# Patient Record
Sex: Female | Born: 1940 | Race: White | Hispanic: No | Smoking: Never smoker
Health system: Southern US, Community
[De-identification: ages and names within clinical notes are randomized; demographics above are authoritative.]

## PROBLEM LIST (undated history)

## (undated) DIAGNOSIS — S42401A Unspecified fracture of lower end of right humerus, initial encounter for closed fracture: Secondary | ICD-10-CM

## (undated) DIAGNOSIS — I10 Essential (primary) hypertension: Secondary | ICD-10-CM

## (undated) DIAGNOSIS — L309 Dermatitis, unspecified: Secondary | ICD-10-CM

## (undated) DIAGNOSIS — M722 Plantar fascial fibromatosis: Secondary | ICD-10-CM

## (undated) DIAGNOSIS — E785 Hyperlipidemia, unspecified: Secondary | ICD-10-CM

## (undated) DIAGNOSIS — C4490 Unspecified malignant neoplasm of skin, unspecified: Secondary | ICD-10-CM

## (undated) DIAGNOSIS — N189 Chronic kidney disease, unspecified: Secondary | ICD-10-CM

## (undated) DIAGNOSIS — K760 Fatty (change of) liver, not elsewhere classified: Secondary | ICD-10-CM

## (undated) DIAGNOSIS — S46019A Strain of muscle(s) and tendon(s) of the rotator cuff of unspecified shoulder, initial encounter: Secondary | ICD-10-CM

## (undated) DIAGNOSIS — M7062 Trochanteric bursitis, left hip: Secondary | ICD-10-CM

## (undated) DIAGNOSIS — C4492 Squamous cell carcinoma of skin, unspecified: Secondary | ICD-10-CM

## (undated) DIAGNOSIS — M5412 Radiculopathy, cervical region: Secondary | ICD-10-CM

## (undated) DIAGNOSIS — J45909 Unspecified asthma, uncomplicated: Secondary | ICD-10-CM

## (undated) HISTORY — DX: Unspecified asthma, uncomplicated: J45.909

## (undated) HISTORY — DX: Hyperlipidemia, unspecified: E78.5

## (undated) HISTORY — DX: Plantar fascial fibromatosis: M72.2

## (undated) HISTORY — DX: Chronic kidney disease, unspecified: N18.9

## (undated) HISTORY — DX: Fatty (change of) liver, not elsewhere classified: K76.0

## (undated) HISTORY — DX: Essential (primary) hypertension: I10

## (undated) HISTORY — DX: Squamous cell carcinoma of skin, unspecified: C44.92

## (undated) HISTORY — DX: Strain of muscle(s) and tendon(s) of the rotator cuff of unspecified shoulder, initial encounter: S46.019A

## (undated) HISTORY — DX: Unspecified fracture of lower end of right humerus, initial encounter for closed fracture: S42.401A

## (undated) HISTORY — DX: Radiculopathy, cervical region: M54.12

## (undated) HISTORY — PX: BREAST CYST ASPIRATION: SHX578

## (undated) HISTORY — DX: Dermatitis, unspecified: L30.9

## (undated) HISTORY — DX: Trochanteric bursitis, left hip: M70.62

---

## 1997-09-17 ENCOUNTER — Other Ambulatory Visit: Admission: RE | Admit: 1997-09-17 | Discharge: 1997-09-17 | Payer: Self-pay | Admitting: *Deleted

## 2000-03-28 ENCOUNTER — Encounter: Payer: Self-pay | Admitting: Internal Medicine

## 2000-03-28 ENCOUNTER — Encounter: Admission: RE | Admit: 2000-03-28 | Discharge: 2000-03-28 | Payer: Self-pay | Admitting: Internal Medicine

## 2001-08-27 ENCOUNTER — Encounter: Payer: Self-pay | Admitting: Internal Medicine

## 2001-08-27 ENCOUNTER — Encounter: Admission: RE | Admit: 2001-08-27 | Discharge: 2001-08-27 | Payer: Self-pay | Admitting: Internal Medicine

## 2003-01-15 ENCOUNTER — Encounter: Admission: RE | Admit: 2003-01-15 | Discharge: 2003-01-15 | Payer: Self-pay | Admitting: Internal Medicine

## 2003-01-15 ENCOUNTER — Encounter: Payer: Self-pay | Admitting: Internal Medicine

## 2004-11-26 ENCOUNTER — Encounter: Admission: RE | Admit: 2004-11-26 | Discharge: 2004-11-26 | Payer: Self-pay | Admitting: Internal Medicine

## 2006-01-12 ENCOUNTER — Encounter: Admission: RE | Admit: 2006-01-12 | Discharge: 2006-01-12 | Payer: Self-pay | Admitting: Internal Medicine

## 2007-11-06 ENCOUNTER — Ambulatory Visit (HOSPITAL_BASED_OUTPATIENT_CLINIC_OR_DEPARTMENT_OTHER): Admission: RE | Admit: 2007-11-06 | Discharge: 2007-11-06 | Payer: Self-pay | Admitting: Orthopedic Surgery

## 2009-05-30 ENCOUNTER — Emergency Department (HOSPITAL_BASED_OUTPATIENT_CLINIC_OR_DEPARTMENT_OTHER): Admission: EM | Admit: 2009-05-30 | Discharge: 2009-05-30 | Payer: Self-pay | Admitting: Emergency Medicine

## 2009-05-30 ENCOUNTER — Ambulatory Visit: Payer: Self-pay | Admitting: Diagnostic Radiology

## 2009-08-13 ENCOUNTER — Encounter: Admission: RE | Admit: 2009-08-13 | Discharge: 2009-08-13 | Payer: Self-pay | Admitting: Internal Medicine

## 2010-10-26 NOTE — Op Note (Signed)
NAMEBEREA, NOGUERAS                 ACCOUNT NO.:  [id number]   MEDICAL RECORD NO.:  [id number]          PATIENT TYPE:  AMB   LOCATION:  [place]                          FACILITY:  [facility]   PHYSICIAN:  Youlanda Mighty. Sypher, M.D. 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   POSTOPERATIVE DIAGNOSIS:  Chronic stenosing tenosynovitis, right thumb  at A1 pulley.   OPERATION:  Release of right thumb A1 pulley.   OPERATING SURGEON:  Theodis Sato, MD   ASSISTANT:  Leverne Humbles, PA-C   ANESTHESIA:  2% lidocaine, metacarpal head level block, and flexor  sheath block right thumb without supplemental intravenous sedation.   This was a minor operating room case.   INDICATIONS:  Chelita Haver is a 70 year old woman, referred to the  courtesy of Dr. Nehemiah Settle.   She has had a history of chronic stenosing tenosynovitis of her right  thumb at the A1 pulley.  Due to a failed respond to nonoperative  measures, she is brought to the operating at this time for release of  her right thumb A1 pulley.   She was offered an opportunity to have surgery with local anesthesia  versus local and sedation.  She chose straight local anesthesia.  She  was brought to room 6, placed in supine position on the operating table,  and after informed consent and Betadine prep, 2% lidocaine was  infiltrated to the path of the intended incision and flexor sheath for  the right thumb.   After a few moments, she had excellent anesthesia of her right thumb.  The arm was then prepped with Betadine soap solution, sterilely draped.  A pneumatic tourniquet was brought at the right brachium.   Following exsanguination of the right arm with Esmarch bandage an  arterial tourniquet of proximal brachium was inflated to 220 mmHg.  The  procedure commenced with  short transverse incision directly over the  palpably thickened A1 pulley.  The subcutaneous tissue was carefully  divided from the radial proper digital nerve.  This was gently retracted  with a Ragnell retractor.  The pulley was isolated with a Soil scientist  followed by splitting of the pulley with scalpel and scissors.  The  tendon was delivered and found to be moderately swollen distal to the  pulley.   The wound was then repaired with mattress sutures of 5-0 nylon.   A compressor dressing was applied with Xeroflo sterile gauze and Coban.   There are no apparent complications.   Ms. Lisa tolerated surgery and anesthesia well.  She was transferred to  recovery room in stable signs.      Youlanda Mighty Sypher, M.D.  Electronically Signed     RVS/MEDQ  D:  11/06/2007  T:  11/07/2007  Job:  [id number]   cc:   Nehemiah Settle, M.D.

## 2014-12-11 ENCOUNTER — Ambulatory Visit (INDEPENDENT_AMBULATORY_CARE_PROVIDER_SITE_OTHER): Payer: PPO | Admitting: Cardiology

## 2014-12-11 ENCOUNTER — Encounter: Payer: Self-pay | Admitting: Cardiology

## 2014-12-11 VITALS — BP 124/74 | HR 64 | Ht 64.0 in | Wt 204.0 lb

## 2014-12-11 DIAGNOSIS — R079 Chest pain, unspecified: Secondary | ICD-10-CM | POA: Insufficient documentation

## 2014-12-11 DIAGNOSIS — I10 Essential (primary) hypertension: Secondary | ICD-10-CM

## 2014-12-11 DIAGNOSIS — R072 Precordial pain: Secondary | ICD-10-CM | POA: Diagnosis not present

## 2014-12-11 DIAGNOSIS — R600 Localized edema: Secondary | ICD-10-CM

## 2014-12-11 DIAGNOSIS — R0609 Other forms of dyspnea: Secondary | ICD-10-CM

## 2014-12-11 DIAGNOSIS — I739 Peripheral vascular disease, unspecified: Secondary | ICD-10-CM | POA: Diagnosis not present

## 2014-12-11 DIAGNOSIS — E785 Hyperlipidemia, unspecified: Secondary | ICD-10-CM

## 2014-12-11 MED ORDER — FUROSEMIDE 20 MG PO TABS: 20.0000 mg | ORAL_TABLET | Freq: Every day | ORAL | Status: DC

## 2014-12-11 NOTE — Progress Notes (Signed)
Patient ID: Caitlin Potts, female   DOB: 17-Apr-1941, 74 y.o.   MRN: [id number]      Cardiology Office Note  Date:  12/11/2014   ID:  [name], [name] 1940/10/30, MRN [id number]  PCP:  No primary care provider on file.  Cardiologist:  Dorothy Spark, MD    Chief complain: DOE  History of Present Illness: Caitlin Potts is a 74 y.o. female who presents for evaluation of DOE. The patient has h/o DM, HTN, HLP, CKD 3, obesity, FH of CAD (sister had MI at age 69) who went to the ER for an exertional retrosternal chest pain. MI was ruled out and she was referred to [place] for further work up. She states that she has been experencing progressively worsening DOE and fatigue with mild exertion like cleaning, doing laundry. She has to take several breaks to finish the task. No palpitations or syncope.  She has been also experiencing LE claudications after walking short distances, mild LE edema, on and off PND.  Past Medical History  Diagnosis Date  . Asthma   . Hypertension   . Cervical radiculopathy   . Rotator cuff strain   . Hyperlipidemia   . Eczema   . Elbow fracture, right   . Plantar fasciitis   . Fatty infiltration of liver   . Chronic kidney disease     stage III  . Greater trochanteric bursitis of left hip   . SCCA (squamous cell carcinoma) of skin     No past surgical history on file.   Current Outpatient Prescriptions  Medication Sig Dispense Refill  . albuterol (PROVENTIL HFA;VENTOLIN HFA) 108 (90 BASE) MCG/ACT inhaler Inhale 2 puffs into the lungs every 4 (four) hours as needed for wheezing or shortness of breath.    Marland Kitchen atorvastatin (LIPITOR) 10 MG tablet Take 10 mg by mouth daily.    . Calcium 600-200 MG-UNIT per tablet Take 1 tablet by mouth 2 (two) times daily.    Marland Kitchen diltiazem (DILACOR XR) 240 MG 24 hr capsule Take 240 mg by mouth daily.    . fluticasone (FLOVENT HFA) 220 MCG/ACT inhaler Inhale 2 puffs into the lungs 2 (two) times daily.    . furosemide (LASIX) 20 MG  tablet Take 1 tablet (20 mg total) by mouth daily. 90 tablet 3  . lisinopril-hydrochlorothiazide (PRINZIDE,ZESTORETIC) 20-25 MG per tablet Take 1 tablet by mouth daily.    . metoprolol succinate (TOPROL-XL) 50 MG 24 hr tablet Take 50 mg by mouth daily. Take with or immediately following a meal.    . Multiple Vitamins-Minerals (MULTIVITAMIN WITH MINERALS) tablet Take 1 tablet by mouth daily.    Marland Kitchen omeprazole (PRILOSEC) 20 MG capsule Take 20 mg by mouth daily.     No current facility-administered medications for this visit.    Allergies:   Penicillins and Sulfa antibiotics    Social History:  The patient  reports that she has never smoked. She does not have any smokeless tobacco history on file.   Family History:  The patient's family history includes CVA in her father; Heart disease in her sister; Lung cancer in her brother; Parkinsonism in her brother and mother.   ROS:  Please see the history of present illness.   Otherwise, review of systems are positive for none.   All other systems are reviewed and negative.   PHYSICAL EXAM: VS:  BP 124/74 mmHg  Pulse 64  Ht 5\' 4"  (1.626 m)  Wt 204 lb (92.534 kg)  BMI  35.00 kg/m2 , BMI Body mass index is 35 kg/(m^2). GEN: Well nourished, well developed, in no acute distress HEENT: normal Neck: no JVD, carotid bruits, or masses Cardiac: RRR; no murmurs, rubs, or gallops,no edema  Respiratory:  clear to auscultation bilaterally, normal work of breathing GI: soft, nontender, nondistended, + BS MS: no deformity or atrophy Skin: warm and dry, no rash Neuro:  Strength and sensation are intact Psych: euthymic mood, full affect  EKG:  EKG is not ordered today. The ekg ordered in ER demonstrates: SB, non-specific ST-T wave abnormalities  Recent Labs: No results found for requested labs within last 365 days.   Lipid Panel No results found for: CHOL, TRIG, HDL, CHOLHDL, VLDL, LDLCALC, LDLDIRECT   HDL 56 LDL 92 TRIG 224 Crea 1.59, GFR 32 HbA1c  6.7%   Wt Readings from Last 3 Encounters:  12/11/14 204 lb (92.534 kg)    Other studies Reviewed: Additional studies/ records that were reviewed today include: ER notes. PCP notes. Review of the above records demonstrates: as per HPI    ASSESSMENT AND PLAN:  1.  Exertional dyspnea - typical suspicious of CAD, we will schedule a Lexiscan nuclear stress test.  2. LE edema, PND - order echocardiogram, add lasix 20 mg po daily  3. Claudications - order B/L LE Duplex  4. HLP - LDL goal < 70, I will increase atorvastatin based on stress test results.   5. HTN - controlled on current regimen  Orders Placed This Encounter  Procedures  . Myocardial Perfusion Imaging  . Echocardiogram   Follow up in 3 month.  Signed, Dorothy Spark, MD  12/11/2014 5:19 PM    [facility] [place], [place], [place]  [address] Phone: [phone]; Fax: [phone]

## 2014-12-11 NOTE — Patient Instructions (Signed)
Medication Instructions:   START LASIX 20 MG ONCE DAILY      Testing/Procedures:  Your physician has requested that you have an echocardiogram. Echocardiography is a painless test that uses sound waves to create images of your heart. It provides your doctor with information about the size and shape of your heart and how well your heart's chambers and valves are working. This procedure takes approximately one hour. There are no restrictions for this procedure.   Your physician has requested that you have a lower  extremity arterial duplex. This test is an ultrasound of the arteries in the legs or arms. It looks at arterial blood flow in the legs and arms. Allow one hour for Lower and Upper Arterial scans. There are no restrictions or special instructions   Your physician has requested that you have a lexiscan myoview. For further information please visit [email or web address]. Please follow instruction sheet, as given.     Follow-Up:  3 MONTHS WITH DR Meda Coffee

## 2014-12-19 ENCOUNTER — Other Ambulatory Visit: Payer: Self-pay

## 2014-12-19 ENCOUNTER — Ambulatory Visit (HOSPITAL_COMMUNITY): Payer: PPO | Attending: Cardiology

## 2014-12-19 ENCOUNTER — Ambulatory Visit (HOSPITAL_BASED_OUTPATIENT_CLINIC_OR_DEPARTMENT_OTHER): Payer: PPO

## 2014-12-19 DIAGNOSIS — I081 Rheumatic disorders of both mitral and tricuspid valves: Secondary | ICD-10-CM | POA: Insufficient documentation

## 2014-12-19 DIAGNOSIS — I739 Peripheral vascular disease, unspecified: Secondary | ICD-10-CM | POA: Diagnosis not present

## 2014-12-19 DIAGNOSIS — R0609 Other forms of dyspnea: Secondary | ICD-10-CM

## 2014-12-19 DIAGNOSIS — R072 Precordial pain: Secondary | ICD-10-CM | POA: Diagnosis not present

## 2014-12-19 DIAGNOSIS — E119 Type 2 diabetes mellitus without complications: Secondary | ICD-10-CM | POA: Insufficient documentation

## 2014-12-19 DIAGNOSIS — E785 Hyperlipidemia, unspecified: Secondary | ICD-10-CM | POA: Diagnosis not present

## 2014-12-19 DIAGNOSIS — I10 Essential (primary) hypertension: Secondary | ICD-10-CM | POA: Insufficient documentation

## 2014-12-23 ENCOUNTER — Telehealth: Payer: Self-pay | Admitting: *Deleted

## 2014-12-23 ENCOUNTER — Telehealth (HOSPITAL_COMMUNITY): Payer: Self-pay

## 2014-12-23 MED ORDER — AMLODIPINE BESYLATE 5 MG PO TABS: 5.0000 mg | ORAL_TABLET | Freq: Every day | ORAL | Status: DC

## 2014-12-23 NOTE — Telephone Encounter (Signed)
Contacted the pt to inform her that per Dr Meda Coffee her echo showed normal LVEF but mild pulmonary HTN, and she recommends switching the pts cardizem to amlodipine 5 mg po daily, and follow-up with Dr Meda Coffee in 2 months.  Confirmed the pharmacy of choice with the pt.  Pt already has a scheduled 2 month follow-up appt with Dr Meda Coffee set for 03/11/18 at 0945.  Pt states she only wants a months supply sent to her pharmacy for right now, to make sure she tolerates the amlodipine well. Sent in a months supply as requested. Pt verbalized understanding and agrees with this plan.

## 2014-12-23 NOTE — Telephone Encounter (Signed)
Patient given detailed instructions per Myocardial Perfusion Study Information Sheet for test on 12-25-2014 at 0900. Patient Notified to arrive 15 minutes early, and that it is imperative to arrive on time for appointment to keep from having the test rescheduled. Patient verbalized understanding. Caitlin Potts, [name] A

## 2014-12-23 NOTE — Telephone Encounter (Signed)
-----   Message from Dorothy Spark, MD sent at 12/21/2014  6:53 AM EDT ----- She has normal LVEF but mild pulmonary hypertension. I would switch cardizem to amlodipine 5 mg po daily and follow up with me in 2 months.

## 2014-12-25 ENCOUNTER — Ambulatory Visit (HOSPITAL_COMMUNITY): Payer: PPO | Attending: Internal Medicine

## 2014-12-25 DIAGNOSIS — I739 Peripheral vascular disease, unspecified: Secondary | ICD-10-CM | POA: Diagnosis not present

## 2014-12-25 DIAGNOSIS — R9439 Abnormal result of other cardiovascular function study: Secondary | ICD-10-CM | POA: Insufficient documentation

## 2014-12-25 DIAGNOSIS — R072 Precordial pain: Secondary | ICD-10-CM

## 2014-12-25 DIAGNOSIS — R0609 Other forms of dyspnea: Secondary | ICD-10-CM | POA: Diagnosis not present

## 2014-12-25 DIAGNOSIS — I10 Essential (primary) hypertension: Secondary | ICD-10-CM | POA: Insufficient documentation

## 2014-12-25 MED ORDER — TECHNETIUM TC 99M SESTAMIBI GENERIC - CARDIOLITE
31.5000 | Freq: Once | INTRAVENOUS | Status: AC | PRN
  Administered 2014-12-25: 32 via INTRAVENOUS

## 2014-12-25 MED ORDER — REGADENOSON 0.4 MG/5ML IV SOLN
0.4000 mg | Freq: Once | INTRAVENOUS | Status: AC
  Administered 2014-12-25: 0.4 mg via INTRAVENOUS

## 2014-12-26 ENCOUNTER — Ambulatory Visit (HOSPITAL_COMMUNITY): Payer: PPO | Attending: Cardiology

## 2014-12-26 ENCOUNTER — Ambulatory Visit (HOSPITAL_COMMUNITY): Payer: PPO

## 2014-12-26 LAB — MYOCARDIAL PERFUSION IMAGING
LV dias vol: 52 mL
LV sys vol: 10 mL
Peak HR: 94 {beats}/min
RATE: 0.32
Rest HR: 68 {beats}/min
SDS: 1
SRS: 3
SSS: 14
TID: 0.74

## 2014-12-26 MED ORDER — TECHNETIUM TC 99M SESTAMIBI GENERIC - CARDIOLITE
31.3000 | Freq: Once | INTRAVENOUS | Status: AC | PRN
  Administered 2014-12-26: 31.3 via INTRAVENOUS

## 2015-01-19 ENCOUNTER — Other Ambulatory Visit: Payer: Self-pay | Admitting: *Deleted

## 2015-01-19 MED ORDER — AMLODIPINE BESYLATE 5 MG PO TABS: 5.0000 mg | ORAL_TABLET | Freq: Every day | ORAL | Status: DC

## 2015-03-12 ENCOUNTER — Ambulatory Visit (INDEPENDENT_AMBULATORY_CARE_PROVIDER_SITE_OTHER): Payer: PPO | Admitting: Cardiology

## 2015-03-12 ENCOUNTER — Encounter: Payer: Self-pay | Admitting: Cardiology

## 2015-03-12 VITALS — BP 124/66 | HR 71 | Ht 64.0 in | Wt 200.0 lb

## 2015-03-12 DIAGNOSIS — I272 Pulmonary hypertension, unspecified: Secondary | ICD-10-CM

## 2015-03-12 DIAGNOSIS — I270 Primary pulmonary hypertension: Secondary | ICD-10-CM | POA: Diagnosis not present

## 2015-03-12 DIAGNOSIS — I10 Essential (primary) hypertension: Secondary | ICD-10-CM | POA: Diagnosis not present

## 2015-03-12 DIAGNOSIS — R0609 Other forms of dyspnea: Secondary | ICD-10-CM | POA: Diagnosis not present

## 2015-03-12 NOTE — Progress Notes (Signed)
Patient ID: Caitlin Potts, female   DOB: Feb 14, 1941, 74 y.o.   MRN: [id number]      Cardiology Office Note  Date:  03/12/2015   ID:  Caitlin Potts, Caitlin Potts 1940/09/29, MRN [id number]  PCP:  Kelton Pillar, Herschell Dimes, MD  Cardiologist:  Dorothy Spark, MD    Chief complain: DOE  History of Present Illness: Caitlin Potts is a 74 y.o. female who presents for evaluation of DOE. The patient has h/o DM, HTN, HLP, CKD 3, obesity, FH of CAD (sister had MI at age 8) who went to the ER for an exertional retrosternal chest pain. MI was ruled out and she was referred to [place] for further work up. She states that she has been experencing progressively worsening DOE and fatigue with mild exertion like cleaning, doing laundry. She has to take several breaks to finish the task. No palpitations or syncope.  She has been also experiencing LE claudications after walking short distances, mild LE edema, on and off PND.  03/12/15 - 3 months follow up, improved SOB with amlodipine that was started for pulmonary hypertension, denies CP, improved LE edema. Amlodipine gave her headaches, improved when started to take it at night.  Past Medical History  Diagnosis Date  . Asthma   . Hypertension   . Cervical radiculopathy   . Rotator cuff strain   . Hyperlipidemia   . Eczema   . Elbow fracture, right   . Plantar fasciitis   . Fatty infiltration of liver   . Chronic kidney disease     stage III  . Greater trochanteric bursitis of left hip   . SCCA (squamous cell carcinoma) of skin     No past surgical history on file.   Current Outpatient Prescriptions  Medication Sig Dispense Refill  . albuterol (PROVENTIL HFA;VENTOLIN HFA) 108 (90 BASE) MCG/ACT inhaler Inhale 2 puffs into the lungs every 4 (four) hours as needed for wheezing or shortness of breath.    Marland Kitchen amLODipine (NORVASC) 5 MG tablet Take 5 mg by mouth daily after supper.    Marland Kitchen atorvastatin (LIPITOR) 10 MG tablet Take 10 mg by mouth daily.    .  Calcium 600-200 MG-UNIT per tablet Take 1 tablet by mouth 2 (two) times daily.    . fluticasone (FLOVENT HFA) 220 MCG/ACT inhaler Inhale 2 puffs into the lungs 2 (two) times daily.    . furosemide (LASIX) 20 MG tablet Take 20 mg by mouth as needed. FOR SWELLING    . lisinopril-hydrochlorothiazide (PRINZIDE,ZESTORETIC) 20-25 MG per tablet Take 1 tablet by mouth daily.    . metoprolol succinate (TOPROL-XL) 50 MG 24 hr tablet Take 50 mg by mouth daily. Take with or immediately following a meal.    . Multiple Vitamins-Minerals (MULTIVITAMIN WITH MINERALS) tablet Take 1 tablet by mouth daily.    Marland Kitchen omeprazole (PRILOSEC) 20 MG capsule Take 20 mg by mouth daily.     No current facility-administered medications for this visit.    Allergies:   Penicillins and Sulfa antibiotics    Social History:  The patient  reports that she has never smoked. She does not have any smokeless tobacco history on file.   Family History:  The patient's family history includes CVA in her father; Heart disease in her sister; Lung cancer in her brother; Parkinsonism in her brother and mother.   ROS:  Please see the history of present illness.   Otherwise, review of systems are positive for none.   All other systems  are reviewed and negative.   PHYSICAL EXAM: VS:  BP 124/66 mmHg  Pulse 71  Ht 5\' 4"  (1.626 m)  Wt 200 lb (90.719 kg)  BMI 34.31 kg/m2 , BMI Body mass index is 34.31 kg/(m^2). GEN: Well nourished, well developed, in no acute distress HEENT: normal Neck: no JVD, carotid bruits, or masses Cardiac: RRR; no murmurs, rubs, or gallops,no edema  Respiratory:  clear to auscultation bilaterally, normal work of breathing GI: soft, nontender, nondistended, + BS MS: no deformity or atrophy Skin: warm and dry, no rash Neuro:  Strength and sensation are intact Psych: euthymic mood, full affect  EKG:  EKG is not ordered today. The ekg ordered in ER demonstrates: SB, non-specific ST-T wave abnormalities  Recent  Labs: No results found for requested labs within last 365 days.   Lipid Panel No results found for: CHOL, TRIG, HDL, CHOLHDL, VLDL, LDLCALC, LDLDIRECT   HDL 56 LDL 92 TRIG 224 Crea 1.59, GFR 32 HbA1c 6.7%   Wt Readings from Last 3 Encounters:  03/12/15 200 lb (90.719 kg)  12/25/14 204 lb (92.534 kg)  12/11/14 204 lb (92.534 kg)    Other studies Reviewed: Additional studies/ records that were reviewed today include: ER notes. PCP notes. Review of the above records demonstrates: as per HPI  Lexiscan nuclear stress test: 12/2014  Nuclear stress EF: 80%.  The left ventricular ejection fraction is hyperdynamic (>65%).  Defect 1: There is a small defect of moderate severity.  This is an intermediate risk study.  TTE 12/2014 Left ventricle: The cavity size was normal. Wall thickness was normal. Systolic function was normal. The estimated ejection fraction was in the range of 60% to 65%. Wall motion was normal; there were no regional wall motion abnormalities. - Mitral valve: There was mild regurgitation. - Left atrium: The atrium was mildly dilated. - Pulmonary arteries: Systolic pressure was mildly increased. PA peak pressure: 43 mm Hg (S).     ASSESSMENT AND PLAN:  1. Exertional dyspnea - negative stress test (normal LVEF, breast attenuation)< pulmonary HTN on echocardiogram   2. Pulmonary hypertension - started on amlodipine, improved DOE  3. LE edema, PND - order echocardiogram, improved with lasix 20 mg po daily, now just PRN  4. Claudications - normal B/L LE Duplex  5. HLP - LDL goal < 70, I will increase atorvastatin based on stress test results.   6. HTN - controlled on current regimen  Follow up in 1 year.Corliss Blacker, MD  03/12/2015 10:23 AM    [place], [place], [place]  [address] Phone: [phone]; Fax: [phone]

## 2015-03-12 NOTE — Patient Instructions (Signed)
Your physician recommends that you continue on your current medications as directed. Please refer to the Current Medication list given to you today.   Your physician wants you to follow-up in: ONE YEAR WITH DR NELSON You will receive a reminder letter in the mail two months in advance. If you don't receive a letter, please call our office to schedule the follow-up appointment.  

## 2015-05-18 ENCOUNTER — Other Ambulatory Visit: Payer: Self-pay | Admitting: Internal Medicine

## 2015-05-18 ENCOUNTER — Ambulatory Visit
Admission: RE | Admit: 2015-05-18 | Discharge: 2015-05-18 | Disposition: A | Discharge status: Home / Self Care | Payer: PPO | Source: Ambulatory Visit | Attending: Internal Medicine | Admitting: Internal Medicine

## 2015-05-18 DIAGNOSIS — M25571 Pain in right ankle and joints of right foot: Secondary | ICD-10-CM

## 2015-05-27 ENCOUNTER — Ambulatory Visit (INDEPENDENT_AMBULATORY_CARE_PROVIDER_SITE_OTHER): Payer: PPO | Admitting: Podiatry

## 2015-05-27 ENCOUNTER — Ambulatory Visit (INDEPENDENT_AMBULATORY_CARE_PROVIDER_SITE_OTHER): Payer: PPO

## 2015-05-27 ENCOUNTER — Ambulatory Visit: Payer: PPO

## 2015-05-27 ENCOUNTER — Encounter: Payer: Self-pay | Admitting: Podiatry

## 2015-05-27 VITALS — BP 125/75 | HR 16 | Resp 16 | Ht 64.0 in | Wt 196.0 lb

## 2015-05-27 DIAGNOSIS — M779 Enthesopathy, unspecified: Secondary | ICD-10-CM | POA: Diagnosis not present

## 2015-05-27 DIAGNOSIS — S93601A Unspecified sprain of right foot, initial encounter: Secondary | ICD-10-CM

## 2015-05-27 MED ORDER — TRIAMCINOLONE ACETONIDE 10 MG/ML IJ SUSP
10.0000 mg | Freq: Once | INTRAMUSCULAR | Status: AC
  Administered 2015-05-27: 10 mg

## 2015-05-27 NOTE — Patient Instructions (Signed)

## 2015-05-27 NOTE — Progress Notes (Signed)
   Subjective:    Patient ID: Caitlin Potts, female    DOB: 1941-02-21, 74 y.o.   MRN: [id number]  HPI Patient presents with ankle pain in their right foot, lateral side; swelling. Pt stated, "their doctor said had a fracture in ankle";  x1.5 months.  Patient also presents with foot pain in their left foot, top of foot; x6 months.   Review of Systems  All other systems reviewed and are negative.      Objective:   Physical Exam        Assessment & Plan:

## 2015-05-28 NOTE — Progress Notes (Signed)
Subjective:     Patient ID: Caitlin Potts, female   DOB: 1940/08/14, 74 y.o.   MRN: [id number]  HPI patient states I've had a lot of pain in my right outside of my foot for about 6 weeks and another doctor thought it was a fracture and on the top of my left foot it seems to be sore specially in my ankle. Not sure of any specific injury   Review of Systems  All other systems reviewed and are negative.      Objective:   Physical Exam  Constitutional: She is oriented to person, place, and time.  Cardiovascular: Intact distal pulses.   Musculoskeletal: Normal range of motion.  Neurological: She is oriented to person, place, and time.  Skin: Skin is warm and dry.  Nursing note and vitals reviewed.  neurovascular status was found to be intact with muscle strength adequate range of motion within normal limits with patient noted to have discomfort of the outside right foot around the peroneal insertion with no indication of tendon dysfunction upon testing. Left sinus tarsi is very tender when pressed and inversion eversion is mildly compromised secondary to splinting. Good digital perfusion noted well oriented 3     Assessment:      inflammatory peroneal tendinitis right width probability for sinus tarsitis left    Plan:      H&P and x-rays of both feet reviewed. Today I did careful sheath injection right at the insertion 3 mg Kenalog 5 mg Xylocaine and for the left I injected the sinus tarsi 3 mg Kenalog 5 mg Xylocaine. Instructed on physical therapy and dispensed fascial brace to bring the right foot into a everted position  And instructed on ice and reduced activity

## 2015-06-17 ENCOUNTER — Ambulatory Visit: Payer: PPO | Admitting: Podiatry

## 2015-06-29 DIAGNOSIS — D0472 Carcinoma in situ of skin of left lower limb, including hip: Secondary | ICD-10-CM | POA: Diagnosis not present

## 2015-06-29 DIAGNOSIS — L821 Other seborrheic keratosis: Secondary | ICD-10-CM | POA: Diagnosis not present

## 2015-06-29 DIAGNOSIS — D1801 Hemangioma of skin and subcutaneous tissue: Secondary | ICD-10-CM | POA: Diagnosis not present

## 2015-06-29 DIAGNOSIS — L820 Inflamed seborrheic keratosis: Secondary | ICD-10-CM | POA: Diagnosis not present

## 2015-06-29 DIAGNOSIS — D225 Melanocytic nevi of trunk: Secondary | ICD-10-CM | POA: Diagnosis not present

## 2015-07-29 DIAGNOSIS — Z08 Encounter for follow-up examination after completed treatment for malignant neoplasm: Secondary | ICD-10-CM | POA: Diagnosis not present

## 2015-07-29 DIAGNOSIS — L820 Inflamed seborrheic keratosis: Secondary | ICD-10-CM | POA: Diagnosis not present

## 2015-07-29 DIAGNOSIS — Z85828 Personal history of other malignant neoplasm of skin: Secondary | ICD-10-CM | POA: Diagnosis not present

## 2015-07-30 ENCOUNTER — Other Ambulatory Visit: Payer: Self-pay | Admitting: *Deleted

## 2015-07-30 MED ORDER — AMLODIPINE BESYLATE 5 MG PO TABS: 5.0000 mg | ORAL_TABLET | Freq: Every day | ORAL | Status: DC

## 2015-08-19 DIAGNOSIS — N2581 Secondary hyperparathyroidism of renal origin: Secondary | ICD-10-CM | POA: Diagnosis not present

## 2015-08-19 DIAGNOSIS — D631 Anemia in chronic kidney disease: Secondary | ICD-10-CM | POA: Diagnosis not present

## 2015-08-19 DIAGNOSIS — N183 Chronic kidney disease, stage 3 (moderate): Secondary | ICD-10-CM | POA: Diagnosis not present

## 2015-08-19 DIAGNOSIS — I129 Hypertensive chronic kidney disease with stage 1 through stage 4 chronic kidney disease, or unspecified chronic kidney disease: Secondary | ICD-10-CM | POA: Diagnosis not present

## 2015-08-28 DIAGNOSIS — M545 Low back pain: Secondary | ICD-10-CM | POA: Diagnosis not present

## 2015-08-31 DIAGNOSIS — M5137 Other intervertebral disc degeneration, lumbosacral region: Secondary | ICD-10-CM | POA: Diagnosis not present

## 2015-09-22 DIAGNOSIS — M5137 Other intervertebral disc degeneration, lumbosacral region: Secondary | ICD-10-CM | POA: Diagnosis not present

## 2015-09-28 DIAGNOSIS — M545 Low back pain: Secondary | ICD-10-CM | POA: Diagnosis not present

## 2015-09-28 DIAGNOSIS — N183 Chronic kidney disease, stage 3 (moderate): Secondary | ICD-10-CM | POA: Diagnosis not present

## 2015-09-28 DIAGNOSIS — M5416 Radiculopathy, lumbar region: Secondary | ICD-10-CM | POA: Diagnosis not present

## 2015-10-01 DIAGNOSIS — M545 Low back pain: Secondary | ICD-10-CM | POA: Diagnosis not present

## 2015-10-01 DIAGNOSIS — M5416 Radiculopathy, lumbar region: Secondary | ICD-10-CM | POA: Diagnosis not present

## 2015-10-05 DIAGNOSIS — M5416 Radiculopathy, lumbar region: Secondary | ICD-10-CM | POA: Diagnosis not present

## 2015-10-05 DIAGNOSIS — M545 Low back pain: Secondary | ICD-10-CM | POA: Diagnosis not present

## 2015-10-06 DIAGNOSIS — N183 Chronic kidney disease, stage 3 (moderate): Secondary | ICD-10-CM | POA: Diagnosis not present

## 2015-10-06 DIAGNOSIS — N2581 Secondary hyperparathyroidism of renal origin: Secondary | ICD-10-CM | POA: Diagnosis not present

## 2015-10-06 DIAGNOSIS — D631 Anemia in chronic kidney disease: Secondary | ICD-10-CM | POA: Diagnosis not present

## 2015-10-06 DIAGNOSIS — I129 Hypertensive chronic kidney disease with stage 1 through stage 4 chronic kidney disease, or unspecified chronic kidney disease: Secondary | ICD-10-CM | POA: Diagnosis not present

## 2015-10-06 DIAGNOSIS — N390 Urinary tract infection, site not specified: Secondary | ICD-10-CM | POA: Diagnosis not present

## 2015-10-12 DIAGNOSIS — M5416 Radiculopathy, lumbar region: Secondary | ICD-10-CM | POA: Diagnosis not present

## 2015-10-12 DIAGNOSIS — M545 Low back pain: Secondary | ICD-10-CM | POA: Diagnosis not present

## 2015-10-14 DIAGNOSIS — M545 Low back pain: Secondary | ICD-10-CM | POA: Diagnosis not present

## 2015-10-14 DIAGNOSIS — M5416 Radiculopathy, lumbar region: Secondary | ICD-10-CM | POA: Diagnosis not present

## 2015-12-30 DIAGNOSIS — N183 Chronic kidney disease, stage 3 (moderate): Secondary | ICD-10-CM | POA: Diagnosis not present

## 2015-12-30 DIAGNOSIS — N2581 Secondary hyperparathyroidism of renal origin: Secondary | ICD-10-CM | POA: Diagnosis not present

## 2015-12-30 DIAGNOSIS — D631 Anemia in chronic kidney disease: Secondary | ICD-10-CM | POA: Diagnosis not present

## 2016-01-04 DIAGNOSIS — N2581 Secondary hyperparathyroidism of renal origin: Secondary | ICD-10-CM | POA: Diagnosis not present

## 2016-01-04 DIAGNOSIS — D631 Anemia in chronic kidney disease: Secondary | ICD-10-CM | POA: Diagnosis not present

## 2016-01-04 DIAGNOSIS — I129 Hypertensive chronic kidney disease with stage 1 through stage 4 chronic kidney disease, or unspecified chronic kidney disease: Secondary | ICD-10-CM | POA: Diagnosis not present

## 2016-01-04 DIAGNOSIS — Z6835 Body mass index (BMI) 35.0-35.9, adult: Secondary | ICD-10-CM | POA: Diagnosis not present

## 2016-01-04 DIAGNOSIS — N183 Chronic kidney disease, stage 3 (moderate): Secondary | ICD-10-CM | POA: Diagnosis not present

## 2016-01-06 DIAGNOSIS — L820 Inflamed seborrheic keratosis: Secondary | ICD-10-CM | POA: Diagnosis not present

## 2016-03-02 ENCOUNTER — Encounter: Payer: Self-pay | Admitting: Cardiology

## 2016-03-08 DIAGNOSIS — H52203 Unspecified astigmatism, bilateral: Secondary | ICD-10-CM | POA: Diagnosis not present

## 2016-03-08 DIAGNOSIS — H26493 Other secondary cataract, bilateral: Secondary | ICD-10-CM | POA: Diagnosis not present

## 2016-03-08 DIAGNOSIS — D485 Neoplasm of uncertain behavior of skin: Secondary | ICD-10-CM | POA: Diagnosis not present

## 2016-03-17 ENCOUNTER — Ambulatory Visit (INDEPENDENT_AMBULATORY_CARE_PROVIDER_SITE_OTHER): Payer: PPO | Admitting: Cardiology

## 2016-03-17 VITALS — BP 124/72 | HR 62 | Ht 64.0 in | Wt 202.0 lb

## 2016-03-17 DIAGNOSIS — E784 Other hyperlipidemia: Secondary | ICD-10-CM | POA: Diagnosis not present

## 2016-03-17 DIAGNOSIS — I10 Essential (primary) hypertension: Secondary | ICD-10-CM | POA: Diagnosis not present

## 2016-03-17 DIAGNOSIS — E782 Mixed hyperlipidemia: Secondary | ICD-10-CM

## 2016-03-17 DIAGNOSIS — I272 Pulmonary hypertension, unspecified: Secondary | ICD-10-CM | POA: Diagnosis not present

## 2016-03-17 DIAGNOSIS — E7849 Other hyperlipidemia: Secondary | ICD-10-CM

## 2016-03-17 DIAGNOSIS — R0609 Other forms of dyspnea: Secondary | ICD-10-CM

## 2016-03-17 DIAGNOSIS — R600 Localized edema: Secondary | ICD-10-CM

## 2016-03-17 NOTE — Patient Instructions (Signed)

## 2016-03-17 NOTE — Progress Notes (Signed)
Patient ID: Caitlin Potts, female   DOB: 09-Aug-1940, 75 y.o.   MRN: [id number]      Cardiology Office Note  Date:  03/17/2016   ID:  Caitlin Potts, Caitlin Potts February 15, 1941, MRN [id number]  PCP:  Caitlin Roup, MD  Cardiologist:  Caitlin Dawley, MD   Chief complain: DOE  History of Present Illness: Caitlin Potts is a 75 y.o. female who presents for evaluation of DOE. The patient has h/o DM, HTN, HLP, CKD 3, obesity, FH of CAD (sister had MI at age 1) who went to the ER for an exertional retrosternal chest pain. MI was ruled out and she was referred to [place] for further work up. She states that she has been experencing progressively worsening DOE and fatigue with mild exertion like cleaning, doing laundry. She has to take several breaks to finish the task. No palpitations or syncope.  She has been also experiencing LE claudications after walking short distances, mild LE edema, on and off PND.  03/12/15 - 3 months follow up, improved SOB with amlodipine that was started for pulmonary hypertension, denies CP, improved LE edema. Amlodipine gave her headaches, improved when started to take it at night.  03/17/2016 - the patient is coming after one year, she has been feeling well, denies any chest pain shortness of breath. She has occasional lower extremity edema especially when she sits for prolonged amount of time such as when she plays cards for 5 hours with her friends. She otherwise denies orthopnea or paroxysmal nocturnal dyspnea. She denies palpitations or syncope.  Past Medical History:  Diagnosis Date  . Asthma   . Cervical radiculopathy   . Chronic kidney disease    stage III  . Eczema   . Elbow fracture, right   . Fatty infiltration of liver   . Greater trochanteric bursitis of left hip   . Hyperlipidemia   . Hypertension   . Plantar fasciitis   . Rotator cuff strain   . SCCA (squamous cell carcinoma) of skin    No past surgical history on file.  Current Outpatient Prescriptions   Medication Sig Dispense Refill  . albuterol (PROVENTIL HFA;VENTOLIN HFA) 108 (90 BASE) MCG/ACT inhaler Inhale 2 puffs into the lungs every 4 (four) hours as needed for wheezing or shortness of breath.    Marland Kitchen amLODipine (NORVASC) 5 MG tablet Take 1 tablet (5 mg total) by mouth daily after supper. 90 tablet 2  . atorvastatin (LIPITOR) 10 MG tablet Take 10 mg by mouth daily.    . Calcium 600-200 MG-UNIT per tablet Take 1 tablet by mouth 2 (two) times daily.    . fluticasone (FLOVENT HFA) 220 MCG/ACT inhaler Inhale 2 puffs into the lungs 2 (two) times daily.    . furosemide (LASIX) 20 MG tablet Take 20 mg by mouth as needed. FOR SWELLING    . lisinopril-hydrochlorothiazide (PRINZIDE,ZESTORETIC) 20-25 MG per tablet Take 1 tablet by mouth daily.    . metoprolol succinate (TOPROL-XL) 50 MG 24 hr tablet Take 50 mg by mouth daily. Take with or immediately following a meal.    . Multiple Vitamins-Minerals (MULTIVITAMIN WITH MINERALS) tablet Take 1 tablet by mouth daily.    Marland Kitchen omeprazole (PRILOSEC) 20 MG capsule Take 20 mg by mouth daily.     No current facility-administered medications for this visit.     Allergies:   Penicillins and Sulfa antibiotics    Social History:  The patient  reports that she has never smoked. She has never used  smokeless tobacco. She reports that she does not drink alcohol or use drugs.   Family History:  The patient's family history includes CVA in her father; Heart disease in her sister; Lung cancer in her brother; Parkinsonism in her brother and mother.   ROS:  Please see the history of present illness.   Otherwise, review of systems are positive for none.   All other systems are reviewed and negative.   PHYSICAL EXAM: VS:  BP 124/72   Pulse 62   Ht 5\' 4"  (1.626 m)   Wt 202 lb (91.6 kg)   BMI 34.67 kg/m  , BMI Body mass index is 34.67 kg/m. GEN: Well nourished, well developed, in no acute distress HEENT: normal Neck: no JVD, carotid bruits, or masses Cardiac: RRR;  no murmurs, rubs, or gallops,no edema  Respiratory:  clear to auscultation bilaterally, normal work of breathing GI: soft, nontender, nondistended, + BS MS: no deformity or atrophy Skin: warm and dry, no rash Neuro:  Strength and sensation are intact Psych: euthymic mood, full affect  EKG:  the ekg obtained today demonstrates: SR, non-specific ST-T wave abnormalities there are no changes when compared to the prior EKG.  Recent Labs: No results found for requested labs within last 8760 hours.   Lipid Panel No results found for: CHOL, TRIG, HDL, CHOLHDL, VLDL, LDLCALC, LDLDIRECT   HDL 56 LDL 92 TRIG 224 Crea 1.59, GFR 32 HbA1c 6.7%   Wt Readings from Last 3 Encounters:  03/17/16 202 lb (91.6 kg)  05/27/15 196 lb (88.9 kg)  03/12/15 200 lb (90.7 kg)    Other studies Reviewed: Additional studies/ records that were reviewed today include: ER notes. PCP notes. Review of the above records demonstrates: as per HPI  Lexiscan nuclear stress test: 12/2014  Nuclear stress EF: 80%.  The left ventricular ejection fraction is hyperdynamic (>65%).  Defect 1: There is a small defect of moderate severity.  This is an intermediate risk study.  TTE 12/2014 Left ventricle: The cavity size was normal. Wall thickness was normal. Systolic function was normal. The estimated ejection fraction was in the range of 60% to 65%. Wall motion was normal; there were no regional wall motion abnormalities. - Mitral valve: There was mild regurgitation. - Left atrium: The atrium was mildly dilated. - Pulmonary arteries: Systolic pressure was mildly increased. PA peak pressure: 43 mm Hg (S).     ASSESSMENT AND PLAN:  1. Exertional dyspnea - negative stress test (normal LVEF, breast attenuation), pulmonary HTN on echocardiogram , her symptoms have improved I would continue the same management.  2. Pulmonary hypertension - started on amlodipine, improved DOE  3. LE edema, PND - continue  Lasix 20 mg by mouth when necessary daily. She is advised to walk once per hour when sitting for prolonged amount of time, she can also use compression stockings for days when she sits a lot.   4. Claudications - normal B/L LE Duplex  5. HLP - LDL goal < 70, I will increase atorvastatin based on stress test results.   6. HTN - controlled on current regimen  Follow up in 1 year. We will obtain lipids, CMP, TSH, CBC.  Signed, Caitlin Dawley, MD  03/17/2016 3:52 PM    [facility] [address], [place], [place]  [address] Phone: [phone]; Fax: [phone]

## 2016-04-21 ENCOUNTER — Other Ambulatory Visit: Payer: Self-pay | Admitting: Cardiology

## 2016-05-13 DIAGNOSIS — I10 Essential (primary) hypertension: Secondary | ICD-10-CM | POA: Diagnosis not present

## 2016-05-13 DIAGNOSIS — E785 Hyperlipidemia, unspecified: Secondary | ICD-10-CM | POA: Diagnosis not present

## 2016-05-13 DIAGNOSIS — K219 Gastro-esophageal reflux disease without esophagitis: Secondary | ICD-10-CM | POA: Diagnosis not present

## 2016-05-13 DIAGNOSIS — R600 Localized edema: Secondary | ICD-10-CM | POA: Diagnosis not present

## 2016-05-13 DIAGNOSIS — J45909 Unspecified asthma, uncomplicated: Secondary | ICD-10-CM | POA: Diagnosis not present

## 2016-05-13 DIAGNOSIS — E119 Type 2 diabetes mellitus without complications: Secondary | ICD-10-CM | POA: Diagnosis not present

## 2016-05-13 DIAGNOSIS — N183 Chronic kidney disease, stage 3 (moderate): Secondary | ICD-10-CM | POA: Diagnosis not present

## 2016-05-13 DIAGNOSIS — R229 Localized swelling, mass and lump, unspecified: Secondary | ICD-10-CM | POA: Diagnosis not present

## 2016-06-28 DIAGNOSIS — D631 Anemia in chronic kidney disease: Secondary | ICD-10-CM | POA: Diagnosis not present

## 2016-06-28 DIAGNOSIS — N183 Chronic kidney disease, stage 3 (moderate): Secondary | ICD-10-CM | POA: Diagnosis not present

## 2016-06-28 DIAGNOSIS — N2581 Secondary hyperparathyroidism of renal origin: Secondary | ICD-10-CM | POA: Diagnosis not present

## 2016-07-04 ENCOUNTER — Other Ambulatory Visit: Payer: Self-pay | Admitting: Family Medicine

## 2016-07-04 DIAGNOSIS — Z1231 Encounter for screening mammogram for malignant neoplasm of breast: Secondary | ICD-10-CM

## 2016-07-19 ENCOUNTER — Ambulatory Visit
Admission: RE | Admit: 2016-07-19 | Discharge: 2016-07-19 | Disposition: A | Discharge status: Home / Self Care | Payer: PPO | Source: Ambulatory Visit | Attending: Family Medicine | Admitting: Family Medicine

## 2016-07-19 DIAGNOSIS — Z1231 Encounter for screening mammogram for malignant neoplasm of breast: Secondary | ICD-10-CM | POA: Diagnosis not present

## 2016-07-19 HISTORY — DX: Unspecified malignant neoplasm of skin, unspecified: C44.90

## 2016-07-21 ENCOUNTER — Other Ambulatory Visit: Payer: Self-pay | Admitting: Family Medicine

## 2016-07-21 DIAGNOSIS — R928 Other abnormal and inconclusive findings on diagnostic imaging of breast: Secondary | ICD-10-CM

## 2016-07-26 ENCOUNTER — Ambulatory Visit
Admission: RE | Admit: 2016-07-26 | Discharge: 2016-07-26 | Disposition: A | Discharge status: Home / Self Care | Payer: PPO | Source: Ambulatory Visit | Attending: Family Medicine | Admitting: Family Medicine

## 2016-07-26 DIAGNOSIS — R928 Other abnormal and inconclusive findings on diagnostic imaging of breast: Secondary | ICD-10-CM | POA: Diagnosis not present

## 2016-08-01 DIAGNOSIS — N183 Chronic kidney disease, stage 3 (moderate): Secondary | ICD-10-CM | POA: Diagnosis not present

## 2016-08-01 DIAGNOSIS — N2581 Secondary hyperparathyroidism of renal origin: Secondary | ICD-10-CM | POA: Diagnosis not present

## 2016-08-01 DIAGNOSIS — I129 Hypertensive chronic kidney disease with stage 1 through stage 4 chronic kidney disease, or unspecified chronic kidney disease: Secondary | ICD-10-CM | POA: Diagnosis not present

## 2016-08-01 DIAGNOSIS — D631 Anemia in chronic kidney disease: Secondary | ICD-10-CM | POA: Diagnosis not present

## 2016-08-01 DIAGNOSIS — E669 Obesity, unspecified: Secondary | ICD-10-CM | POA: Diagnosis not present

## 2016-08-03 DIAGNOSIS — Z0000 Encounter for general adult medical examination without abnormal findings: Secondary | ICD-10-CM | POA: Diagnosis not present

## 2016-08-03 DIAGNOSIS — Z1211 Encounter for screening for malignant neoplasm of colon: Secondary | ICD-10-CM | POA: Diagnosis not present

## 2016-08-03 DIAGNOSIS — I10 Essential (primary) hypertension: Secondary | ICD-10-CM | POA: Diagnosis not present

## 2016-08-03 DIAGNOSIS — E119 Type 2 diabetes mellitus without complications: Secondary | ICD-10-CM | POA: Diagnosis not present

## 2016-08-03 DIAGNOSIS — Z1389 Encounter for screening for other disorder: Secondary | ICD-10-CM | POA: Diagnosis not present

## 2016-08-03 DIAGNOSIS — E785 Hyperlipidemia, unspecified: Secondary | ICD-10-CM | POA: Diagnosis not present

## 2016-08-15 DIAGNOSIS — L820 Inflamed seborrheic keratosis: Secondary | ICD-10-CM | POA: Diagnosis not present

## 2016-08-15 DIAGNOSIS — Z1283 Encounter for screening for malignant neoplasm of skin: Secondary | ICD-10-CM | POA: Diagnosis not present

## 2016-08-15 DIAGNOSIS — B078 Other viral warts: Secondary | ICD-10-CM | POA: Diagnosis not present

## 2016-10-04 ENCOUNTER — Ambulatory Visit (INDEPENDENT_AMBULATORY_CARE_PROVIDER_SITE_OTHER): Payer: PPO | Admitting: Sports Medicine

## 2016-10-04 ENCOUNTER — Ambulatory Visit: Payer: PPO

## 2016-10-04 ENCOUNTER — Ambulatory Visit (INDEPENDENT_AMBULATORY_CARE_PROVIDER_SITE_OTHER): Payer: PPO

## 2016-10-04 DIAGNOSIS — M779 Enthesopathy, unspecified: Secondary | ICD-10-CM | POA: Diagnosis not present

## 2016-10-04 DIAGNOSIS — M7662 Achilles tendinitis, left leg: Secondary | ICD-10-CM

## 2016-10-04 MED ORDER — DICLOFENAC SODIUM 1 % TD GEL: 4.0000 g | Freq: Four times a day (QID) | TRANSDERMAL | 2 refills | Status: AC

## 2016-10-04 NOTE — Progress Notes (Signed)
Subjective: Caitlin Potts is a 76 y.o. female patient who presents to office for evaluation of Left heel pain. Patient complains of progressive pain especially over the last 1-2 weeks off and on at left foot at the Achilles. Rest and ice helped. Patient is not sure what started the pain. Patient denies any other pedal complaints.   Patient Active Problem List   Diagnosis Date Noted  . HTN (hypertension) 03/12/2015  . Pulmonary HTN ([place]) 03/12/2015  . Chest pain 12/11/2014  . Claudication ([place]) 12/11/2014  . DOE (dyspnea on exertion) 12/11/2014    Current Outpatient Prescriptions on File Prior to Visit  Medication Sig Dispense Refill  . albuterol (PROVENTIL HFA;VENTOLIN HFA) 108 (90 BASE) MCG/ACT inhaler Inhale 2 puffs into the lungs every 4 (four) hours as needed for wheezing or shortness of breath.    Marland Kitchen amLODipine (NORVASC) 5 MG tablet TAKE ONE TABLET BY MOUTH EVERY DAY AFTER SUPPER 90 tablet 3  . atorvastatin (LIPITOR) 10 MG tablet Take 10 mg by mouth daily.    . Calcium 600-200 MG-UNIT per tablet Take 1 tablet by mouth 2 (two) times daily.    . fluticasone (FLOVENT HFA) 220 MCG/ACT inhaler Inhale 2 puffs into the lungs 2 (two) times daily.    . furosemide (LASIX) 20 MG tablet Take 20 mg by mouth as needed. FOR SWELLING    . lisinopril-hydrochlorothiazide (PRINZIDE,ZESTORETIC) 20-25 MG per tablet Take 1 tablet by mouth daily.    . metoprolol succinate (TOPROL-XL) 50 MG 24 hr tablet Take 50 mg by mouth daily. Take with or immediately following a meal.    . Multiple Vitamins-Minerals (MULTIVITAMIN WITH MINERALS) tablet Take 1 tablet by mouth daily.    Marland Kitchen omeprazole (PRILOSEC) 20 MG capsule Take 20 mg by mouth daily.     No current facility-administered medications on file prior to visit.     Allergies  Allergen Reactions  . Penicillins Rash  . Sulfa Antibiotics Rash    Objective:  General: Alert and oriented x3 in no acute distress  Dermatology: No open lesions bilateral lower  extremities, no webspace macerations, no ecchymosis bilateral, all nails x 10 are well manicured.  Vascular: Dorsalis Pedis and Posterior Tibial pedal pulses 1/4, Capillary Fill Time 3 seconds, + pedal hair growth bilateral, no edema bilateral lower extremities except at posterior heel on left, Temperature gradient within normal limits.  Neurology: Johney Maine sensation intact via light touch bilateral.   Musculoskeletal: Mild tenderness with palpation at the central>lateral>medial insertion of the Achilles on Left, there is calcaneal exostosis with mild soft tissue present and decreased ankle rom with knee extending  vs flexed resembling gastroc equnius bilateral, The achilles tendon feels intact with no nodularity or palpable dell, Thompson sign negative.  Xrays  Left Foot    Impression: Normal osseous mineralization. Joint spaces preserved. No fracture/dislocation/boney destruction. Calcaneal spur present. Kager's triangle intact with no obliteration. No soft tissue abnormalities or radiopaque foreign bodies.   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  Landis Martins, DPM

## 2016-10-04 NOTE — Patient Instructions (Signed)
Achilles Tendinitis  Achilles tendinitis is inflammation of the tough, cord-like band that attaches the lower leg muscles to the heel bone (Achilles tendon). This is usually caused by overusing the tendon and the ankle joint.  Achilles tendinitis usually gets better over time with treatment and caring for yourself at home. It can take weeks or months to heal completely.  What are the causes?  This condition may be caused by:  · A sudden increase in exercise or activity, such as running.  · Doing the same exercises or activities (such as jumping) over and over.  · Not warming up calf muscles before exercising.  · Exercising in shoes that are worn out or not made for exercise.  · Having arthritis or a bone growth (spur) on the back of the heel bone. This can rub against the tendon and hurt it.  · Age-related wear and tear. Tendons become less flexible with age and more likely to be injured.    What are the signs or symptoms?  Common symptoms of this condition include:  · Pain in the Achilles tendon or in the back of the leg, just above the heel. The pain usually gets worse with exercise.  · Stiffness or soreness in the back of the leg, especially in the morning.  · Swelling of the skin over the Achilles tendon.  · Thickening of the tendon.  · Bone spurs at the bottom of the Achilles tendon, near the heel.  · Trouble standing on tiptoe.    How is this diagnosed?  This condition is diagnosed based on your symptoms and a physical exam. You may have tests, including:  · X-rays.  · MRI.    How is this treated?  The goal of treatment is to relieve symptoms and help your injury heal. Treatment may include:  · Decreasing or stopping activities that caused the tendinitis. This may mean switching to low-impact exercises like biking or swimming.  · Icing the injured area.  · Doing physical therapy, including strengthening and stretching exercises.  · NSAIDs to help relieve pain and swelling.  · Using supportive shoes, wraps,  heel lifts, or a walking boot (air cast).  · Surgery. This may be done if your symptoms do not improve after 6 months.  · Using high-energy shock wave impulses to stimulate the healing process (extracorporeal shock wave therapy). This is rare.  · Injection of medicines to help relieve inflammation (corticosteroids). This is rare.    Follow these instructions at home:  If you have an air cast:   · Wear the cast as told by your health care provider. Remove it only as told by your health care provider.  · Loosen the cast if your toes tingle, become numb, or turn cold and blue.  Activity   · Gradually return to your normal activities once your health care provider approves. Do not do activities that cause pain.  ? Consider doing low-impact exercises, like cycling or swimming.  · If you have an air cast, ask your health care provider when it is safe for you to drive.  · If physical therapy was prescribed, do exercises as told by your health care provider or physical therapist.  Managing pain, stiffness, and swelling   · Raise (elevate) your foot above the level of your heart while you are sitting or lying down.  · Move your toes often to avoid stiffness and to lessen swelling.  · If directed, put ice on the injured area:  ?   Put ice in a plastic bag.  ? Place a towel between your skin and the bag.  ? Leave the ice on for 20 minutes, 2-3 times a day  General instructions   · If directed, wrap your foot with an elastic bandage or other wrap. This can help keep your tendon from moving too much while it heals. Your health care provider will show you how to wrap your foot correctly.  · Wear supportive shoes or heel lifts only as told by your health care provider.  · Take over-the-counter and prescription medicines only as told by your health care provider.  · Keep all follow-up visits as told by your health care provider. This is important.  Contact a health care provider if:  · You have symptoms that gets worse.  · You have  pain that does not get better with medicine.  · You develop new, unexplained symptoms.  · You develop warmth and swelling in your foot.  · You have a fever.  Get help right away if:  · You have a sudden popping sound or sensation in your Achilles tendon followed by severe pain.  · You cannot move your toes or foot.  · You cannot put any weight on your foot.  Summary  · Achilles tendinitis is inflammation of the tough, cord-like band that attaches the lower leg muscles to the heel bone (Achilles tendon).  · This condition is usually caused by overusing the tendon and the ankle joint. It can also be caused by arthritis or normal aging.  · The most common symptoms of this condition include pain, swelling, or stiffness in the Achilles tendon or in the back of the leg.  · This condition is usually treated with rest, NSAIDs, and physical therapy.  This information is not intended to replace advice given to you by your health care provider. Make sure you discuss any questions you have with your health care provider.  Document Released: 03/09/2005 Document Revised: 04/18/2016 Document Reviewed: 04/18/2016  Elsevier Interactive Patient Education © 2017 [employer]

## 2016-12-09 DIAGNOSIS — Z1211 Encounter for screening for malignant neoplasm of colon: Secondary | ICD-10-CM | POA: Diagnosis not present

## 2016-12-12 DIAGNOSIS — R600 Localized edema: Secondary | ICD-10-CM | POA: Diagnosis not present

## 2016-12-12 DIAGNOSIS — I10 Essential (primary) hypertension: Secondary | ICD-10-CM | POA: Diagnosis not present

## 2016-12-12 DIAGNOSIS — E785 Hyperlipidemia, unspecified: Secondary | ICD-10-CM | POA: Diagnosis not present

## 2016-12-12 DIAGNOSIS — E119 Type 2 diabetes mellitus without complications: Secondary | ICD-10-CM | POA: Diagnosis not present

## 2017-01-27 DIAGNOSIS — M25562 Pain in left knee: Secondary | ICD-10-CM | POA: Diagnosis not present

## 2017-01-28 DIAGNOSIS — S83242A Other tear of medial meniscus, current injury, left knee, initial encounter: Secondary | ICD-10-CM | POA: Diagnosis not present

## 2017-01-30 DIAGNOSIS — L820 Inflamed seborrheic keratosis: Secondary | ICD-10-CM | POA: Diagnosis not present

## 2017-01-30 DIAGNOSIS — L570 Actinic keratosis: Secondary | ICD-10-CM | POA: Diagnosis not present

## 2017-01-30 DIAGNOSIS — X32XXXD Exposure to sunlight, subsequent encounter: Secondary | ICD-10-CM | POA: Diagnosis not present

## 2017-03-14 DIAGNOSIS — H26493 Other secondary cataract, bilateral: Secondary | ICD-10-CM | POA: Diagnosis not present

## 2017-03-14 DIAGNOSIS — H524 Presbyopia: Secondary | ICD-10-CM | POA: Diagnosis not present

## 2017-03-15 DIAGNOSIS — M25562 Pain in left knee: Secondary | ICD-10-CM | POA: Diagnosis not present

## 2017-03-21 DIAGNOSIS — N183 Chronic kidney disease, stage 3 (moderate): Secondary | ICD-10-CM | POA: Diagnosis not present

## 2017-03-21 DIAGNOSIS — N2581 Secondary hyperparathyroidism of renal origin: Secondary | ICD-10-CM | POA: Diagnosis not present

## 2017-03-21 DIAGNOSIS — D631 Anemia in chronic kidney disease: Secondary | ICD-10-CM | POA: Diagnosis not present

## 2017-03-23 DIAGNOSIS — M25562 Pain in left knee: Secondary | ICD-10-CM | POA: Diagnosis not present

## 2017-03-27 DIAGNOSIS — N183 Chronic kidney disease, stage 3 (moderate): Secondary | ICD-10-CM | POA: Diagnosis not present

## 2017-03-27 DIAGNOSIS — I129 Hypertensive chronic kidney disease with stage 1 through stage 4 chronic kidney disease, or unspecified chronic kidney disease: Secondary | ICD-10-CM | POA: Diagnosis not present

## 2017-03-27 DIAGNOSIS — D631 Anemia in chronic kidney disease: Secondary | ICD-10-CM | POA: Diagnosis not present

## 2017-03-27 DIAGNOSIS — N2581 Secondary hyperparathyroidism of renal origin: Secondary | ICD-10-CM | POA: Diagnosis not present

## 2017-03-30 DIAGNOSIS — M25562 Pain in left knee: Secondary | ICD-10-CM | POA: Diagnosis not present

## 2017-03-30 DIAGNOSIS — S83222A Peripheral tear of medial meniscus, current injury, left knee, initial encounter: Secondary | ICD-10-CM | POA: Diagnosis not present

## 2017-04-16 ENCOUNTER — Other Ambulatory Visit: Payer: Self-pay | Admitting: Cardiology

## 2017-05-16 ENCOUNTER — Other Ambulatory Visit: Payer: Self-pay | Admitting: Cardiology

## 2017-05-30 ENCOUNTER — Other Ambulatory Visit: Payer: Self-pay | Admitting: Cardiology

## 2017-07-17 DIAGNOSIS — Z23 Encounter for immunization: Secondary | ICD-10-CM | POA: Diagnosis not present

## 2017-07-17 DIAGNOSIS — N183 Chronic kidney disease, stage 3 (moderate): Secondary | ICD-10-CM | POA: Diagnosis not present

## 2017-07-17 DIAGNOSIS — I129 Hypertensive chronic kidney disease with stage 1 through stage 4 chronic kidney disease, or unspecified chronic kidney disease: Secondary | ICD-10-CM | POA: Diagnosis not present

## 2017-07-17 DIAGNOSIS — J069 Acute upper respiratory infection, unspecified: Secondary | ICD-10-CM | POA: Diagnosis not present

## 2017-07-17 DIAGNOSIS — Z1389 Encounter for screening for other disorder: Secondary | ICD-10-CM | POA: Diagnosis not present

## 2017-07-17 DIAGNOSIS — Z0000 Encounter for general adult medical examination without abnormal findings: Secondary | ICD-10-CM | POA: Diagnosis not present

## 2017-07-17 DIAGNOSIS — R600 Localized edema: Secondary | ICD-10-CM | POA: Diagnosis not present

## 2017-07-17 DIAGNOSIS — E119 Type 2 diabetes mellitus without complications: Secondary | ICD-10-CM | POA: Diagnosis not present

## 2017-07-17 DIAGNOSIS — Z6835 Body mass index (BMI) 35.0-35.9, adult: Secondary | ICD-10-CM | POA: Diagnosis not present

## 2017-07-17 DIAGNOSIS — E785 Hyperlipidemia, unspecified: Secondary | ICD-10-CM | POA: Diagnosis not present

## 2017-08-08 ENCOUNTER — Other Ambulatory Visit: Payer: Self-pay | Admitting: Family Medicine

## 2017-08-08 DIAGNOSIS — Z1231 Encounter for screening mammogram for malignant neoplasm of breast: Secondary | ICD-10-CM

## 2017-08-24 ENCOUNTER — Ambulatory Visit
Admission: RE | Admit: 2017-08-24 | Discharge: 2017-08-24 | Disposition: A | Discharge status: Home / Self Care | Payer: PPO | Source: Ambulatory Visit | Attending: Family Medicine | Admitting: Family Medicine

## 2017-08-24 DIAGNOSIS — Z1231 Encounter for screening mammogram for malignant neoplasm of breast: Secondary | ICD-10-CM

## 2017-12-27 DIAGNOSIS — N183 Chronic kidney disease, stage 3 (moderate): Secondary | ICD-10-CM | POA: Diagnosis not present

## 2017-12-27 DIAGNOSIS — N2581 Secondary hyperparathyroidism of renal origin: Secondary | ICD-10-CM | POA: Diagnosis not present

## 2017-12-27 DIAGNOSIS — N189 Chronic kidney disease, unspecified: Secondary | ICD-10-CM | POA: Diagnosis not present

## 2018-01-02 DIAGNOSIS — I129 Hypertensive chronic kidney disease with stage 1 through stage 4 chronic kidney disease, or unspecified chronic kidney disease: Secondary | ICD-10-CM | POA: Diagnosis not present

## 2018-01-02 DIAGNOSIS — D631 Anemia in chronic kidney disease: Secondary | ICD-10-CM | POA: Diagnosis not present

## 2018-01-02 DIAGNOSIS — N2581 Secondary hyperparathyroidism of renal origin: Secondary | ICD-10-CM | POA: Diagnosis not present

## 2018-01-02 DIAGNOSIS — N183 Chronic kidney disease, stage 3 (moderate): Secondary | ICD-10-CM | POA: Diagnosis not present

## 2018-01-22 DIAGNOSIS — L821 Other seborrheic keratosis: Secondary | ICD-10-CM | POA: Diagnosis not present

## 2018-01-22 DIAGNOSIS — L820 Inflamed seborrheic keratosis: Secondary | ICD-10-CM | POA: Diagnosis not present

## 2018-01-22 DIAGNOSIS — Z1283 Encounter for screening for malignant neoplasm of skin: Secondary | ICD-10-CM | POA: Diagnosis not present

## 2018-03-21 DIAGNOSIS — Z23 Encounter for immunization: Secondary | ICD-10-CM | POA: Diagnosis not present

## 2018-03-21 DIAGNOSIS — I129 Hypertensive chronic kidney disease with stage 1 through stage 4 chronic kidney disease, or unspecified chronic kidney disease: Secondary | ICD-10-CM | POA: Diagnosis not present

## 2018-03-21 DIAGNOSIS — E785 Hyperlipidemia, unspecified: Secondary | ICD-10-CM | POA: Diagnosis not present

## 2018-03-21 DIAGNOSIS — R5383 Other fatigue: Secondary | ICD-10-CM | POA: Diagnosis not present

## 2018-03-21 DIAGNOSIS — Z6835 Body mass index (BMI) 35.0-35.9, adult: Secondary | ICD-10-CM | POA: Diagnosis not present

## 2018-03-21 DIAGNOSIS — E1169 Type 2 diabetes mellitus with other specified complication: Secondary | ICD-10-CM | POA: Diagnosis not present

## 2018-03-21 DIAGNOSIS — N184 Chronic kidney disease, stage 4 (severe): Secondary | ICD-10-CM | POA: Diagnosis not present

## 2018-07-31 DIAGNOSIS — N183 Chronic kidney disease, stage 3 (moderate): Secondary | ICD-10-CM | POA: Diagnosis not present

## 2018-08-08 DIAGNOSIS — D631 Anemia in chronic kidney disease: Secondary | ICD-10-CM | POA: Diagnosis not present

## 2018-08-08 DIAGNOSIS — N2581 Secondary hyperparathyroidism of renal origin: Secondary | ICD-10-CM | POA: Diagnosis not present

## 2018-08-08 DIAGNOSIS — N183 Chronic kidney disease, stage 3 (moderate): Secondary | ICD-10-CM | POA: Diagnosis not present

## 2018-08-08 DIAGNOSIS — I129 Hypertensive chronic kidney disease with stage 1 through stage 4 chronic kidney disease, or unspecified chronic kidney disease: Secondary | ICD-10-CM | POA: Diagnosis not present

## 2018-10-24 ENCOUNTER — Other Ambulatory Visit: Payer: Self-pay | Admitting: Family Medicine

## 2018-10-24 DIAGNOSIS — Z1231 Encounter for screening mammogram for malignant neoplasm of breast: Secondary | ICD-10-CM

## 2018-11-13 DIAGNOSIS — E785 Hyperlipidemia, unspecified: Secondary | ICD-10-CM | POA: Diagnosis not present

## 2018-11-13 DIAGNOSIS — J45909 Unspecified asthma, uncomplicated: Secondary | ICD-10-CM | POA: Diagnosis not present

## 2018-11-13 DIAGNOSIS — Z1389 Encounter for screening for other disorder: Secondary | ICD-10-CM | POA: Diagnosis not present

## 2018-11-13 DIAGNOSIS — I129 Hypertensive chronic kidney disease with stage 1 through stage 4 chronic kidney disease, or unspecified chronic kidney disease: Secondary | ICD-10-CM | POA: Diagnosis not present

## 2018-11-13 DIAGNOSIS — N183 Chronic kidney disease, stage 3 (moderate): Secondary | ICD-10-CM | POA: Diagnosis not present

## 2018-11-13 DIAGNOSIS — Z0000 Encounter for general adult medical examination without abnormal findings: Secondary | ICD-10-CM | POA: Diagnosis not present

## 2018-11-13 DIAGNOSIS — K219 Gastro-esophageal reflux disease without esophagitis: Secondary | ICD-10-CM | POA: Diagnosis not present

## 2018-11-13 DIAGNOSIS — E119 Type 2 diabetes mellitus without complications: Secondary | ICD-10-CM | POA: Diagnosis not present

## 2018-12-17 ENCOUNTER — Ambulatory Visit
Admission: RE | Admit: 2018-12-17 | Discharge: 2018-12-17 | Disposition: A | Discharge status: Home / Self Care | Payer: PPO | Source: Ambulatory Visit | Attending: Family Medicine | Admitting: Family Medicine

## 2018-12-17 DIAGNOSIS — Z1231 Encounter for screening mammogram for malignant neoplasm of breast: Secondary | ICD-10-CM

## 2018-12-27 DIAGNOSIS — Z85828 Personal history of other malignant neoplasm of skin: Secondary | ICD-10-CM | POA: Diagnosis not present

## 2018-12-27 DIAGNOSIS — D0471 Carcinoma in situ of skin of right lower limb, including hip: Secondary | ICD-10-CM | POA: Diagnosis not present

## 2018-12-27 DIAGNOSIS — D0472 Carcinoma in situ of skin of left lower limb, including hip: Secondary | ICD-10-CM | POA: Diagnosis not present

## 2018-12-27 DIAGNOSIS — L570 Actinic keratosis: Secondary | ICD-10-CM | POA: Diagnosis not present

## 2018-12-27 DIAGNOSIS — D0461 Carcinoma in situ of skin of right upper limb, including shoulder: Secondary | ICD-10-CM | POA: Diagnosis not present

## 2018-12-27 DIAGNOSIS — D0439 Carcinoma in situ of skin of other parts of face: Secondary | ICD-10-CM | POA: Diagnosis not present

## 2018-12-27 DIAGNOSIS — D485 Neoplasm of uncertain behavior of skin: Secondary | ICD-10-CM | POA: Diagnosis not present

## 2018-12-27 DIAGNOSIS — L821 Other seborrheic keratosis: Secondary | ICD-10-CM | POA: Diagnosis not present

## 2019-04-03 DIAGNOSIS — D0461 Carcinoma in situ of skin of right upper limb, including shoulder: Secondary | ICD-10-CM | POA: Diagnosis not present

## 2019-04-03 DIAGNOSIS — D485 Neoplasm of uncertain behavior of skin: Secondary | ICD-10-CM | POA: Diagnosis not present

## 2019-04-03 DIAGNOSIS — D0471 Carcinoma in situ of skin of right lower limb, including hip: Secondary | ICD-10-CM | POA: Diagnosis not present

## 2019-04-03 DIAGNOSIS — B079 Viral wart, unspecified: Secondary | ICD-10-CM | POA: Diagnosis not present

## 2019-04-03 DIAGNOSIS — L570 Actinic keratosis: Secondary | ICD-10-CM | POA: Diagnosis not present

## 2019-04-03 DIAGNOSIS — L820 Inflamed seborrheic keratosis: Secondary | ICD-10-CM | POA: Diagnosis not present

## 2019-04-03 DIAGNOSIS — B351 Tinea unguium: Secondary | ICD-10-CM | POA: Diagnosis not present

## 2019-04-03 DIAGNOSIS — D1722 Benign lipomatous neoplasm of skin and subcutaneous tissue of left arm: Secondary | ICD-10-CM | POA: Diagnosis not present

## 2019-04-03 DIAGNOSIS — Z85828 Personal history of other malignant neoplasm of skin: Secondary | ICD-10-CM | POA: Diagnosis not present

## 2019-04-03 DIAGNOSIS — D0472 Carcinoma in situ of skin of left lower limb, including hip: Secondary | ICD-10-CM | POA: Diagnosis not present

## 2019-04-03 DIAGNOSIS — D0462 Carcinoma in situ of skin of left upper limb, including shoulder: Secondary | ICD-10-CM | POA: Diagnosis not present

## 2019-04-03 DIAGNOSIS — C44729 Squamous cell carcinoma of skin of left lower limb, including hip: Secondary | ICD-10-CM | POA: Diagnosis not present

## 2019-07-01 DIAGNOSIS — D0471 Carcinoma in situ of skin of right lower limb, including hip: Secondary | ICD-10-CM | POA: Diagnosis not present

## 2019-07-01 DIAGNOSIS — D0472 Carcinoma in situ of skin of left lower limb, including hip: Secondary | ICD-10-CM | POA: Diagnosis not present

## 2019-07-01 DIAGNOSIS — D485 Neoplasm of uncertain behavior of skin: Secondary | ICD-10-CM | POA: Diagnosis not present

## 2019-07-01 DIAGNOSIS — Z85828 Personal history of other malignant neoplasm of skin: Secondary | ICD-10-CM | POA: Diagnosis not present

## 2019-07-01 DIAGNOSIS — L570 Actinic keratosis: Secondary | ICD-10-CM | POA: Diagnosis not present

## 2019-08-19 DIAGNOSIS — N183 Chronic kidney disease, stage 3 unspecified: Secondary | ICD-10-CM | POA: Diagnosis not present

## 2019-08-29 DIAGNOSIS — N2581 Secondary hyperparathyroidism of renal origin: Secondary | ICD-10-CM | POA: Diagnosis not present

## 2019-08-29 DIAGNOSIS — D631 Anemia in chronic kidney disease: Secondary | ICD-10-CM | POA: Diagnosis not present

## 2019-08-29 DIAGNOSIS — N183 Chronic kidney disease, stage 3 unspecified: Secondary | ICD-10-CM | POA: Diagnosis not present

## 2019-08-29 DIAGNOSIS — I129 Hypertensive chronic kidney disease with stage 1 through stage 4 chronic kidney disease, or unspecified chronic kidney disease: Secondary | ICD-10-CM | POA: Diagnosis not present

## 2019-09-25 DIAGNOSIS — N184 Chronic kidney disease, stage 4 (severe): Secondary | ICD-10-CM | POA: Diagnosis not present

## 2019-09-25 DIAGNOSIS — I129 Hypertensive chronic kidney disease with stage 1 through stage 4 chronic kidney disease, or unspecified chronic kidney disease: Secondary | ICD-10-CM | POA: Diagnosis not present

## 2019-09-25 DIAGNOSIS — Z6835 Body mass index (BMI) 35.0-35.9, adult: Secondary | ICD-10-CM | POA: Diagnosis not present

## 2019-09-25 DIAGNOSIS — E785 Hyperlipidemia, unspecified: Secondary | ICD-10-CM | POA: Diagnosis not present

## 2019-09-25 DIAGNOSIS — E1169 Type 2 diabetes mellitus with other specified complication: Secondary | ICD-10-CM | POA: Diagnosis not present

## 2019-09-30 DIAGNOSIS — Z85828 Personal history of other malignant neoplasm of skin: Secondary | ICD-10-CM | POA: Diagnosis not present

## 2019-09-30 DIAGNOSIS — D0472 Carcinoma in situ of skin of left lower limb, including hip: Secondary | ICD-10-CM | POA: Diagnosis not present

## 2019-09-30 DIAGNOSIS — D485 Neoplasm of uncertain behavior of skin: Secondary | ICD-10-CM | POA: Diagnosis not present

## 2019-09-30 DIAGNOSIS — D0471 Carcinoma in situ of skin of right lower limb, including hip: Secondary | ICD-10-CM | POA: Diagnosis not present

## 2019-09-30 DIAGNOSIS — L570 Actinic keratosis: Secondary | ICD-10-CM | POA: Diagnosis not present

## 2019-09-30 DIAGNOSIS — L821 Other seborrheic keratosis: Secondary | ICD-10-CM | POA: Diagnosis not present

## 2020-01-21 DIAGNOSIS — N1832 Chronic kidney disease, stage 3b: Secondary | ICD-10-CM | POA: Diagnosis not present

## 2020-01-21 DIAGNOSIS — J45909 Unspecified asthma, uncomplicated: Secondary | ICD-10-CM | POA: Diagnosis not present

## 2020-01-21 DIAGNOSIS — E1122 Type 2 diabetes mellitus with diabetic chronic kidney disease: Secondary | ICD-10-CM | POA: Diagnosis not present

## 2020-01-21 DIAGNOSIS — Z0000 Encounter for general adult medical examination without abnormal findings: Secondary | ICD-10-CM | POA: Diagnosis not present

## 2020-01-21 DIAGNOSIS — I129 Hypertensive chronic kidney disease with stage 1 through stage 4 chronic kidney disease, or unspecified chronic kidney disease: Secondary | ICD-10-CM | POA: Diagnosis not present

## 2020-01-21 DIAGNOSIS — E785 Hyperlipidemia, unspecified: Secondary | ICD-10-CM | POA: Diagnosis not present

## 2020-01-21 DIAGNOSIS — K219 Gastro-esophageal reflux disease without esophagitis: Secondary | ICD-10-CM | POA: Diagnosis not present

## 2020-01-21 DIAGNOSIS — Z1389 Encounter for screening for other disorder: Secondary | ICD-10-CM | POA: Diagnosis not present

## 2020-01-22 ENCOUNTER — Other Ambulatory Visit: Payer: Self-pay | Admitting: Family Medicine

## 2020-01-22 DIAGNOSIS — Z1231 Encounter for screening mammogram for malignant neoplasm of breast: Secondary | ICD-10-CM

## 2020-01-22 DIAGNOSIS — E2839 Other primary ovarian failure: Secondary | ICD-10-CM

## 2020-01-30 DIAGNOSIS — D485 Neoplasm of uncertain behavior of skin: Secondary | ICD-10-CM | POA: Diagnosis not present

## 2020-01-30 DIAGNOSIS — D0472 Carcinoma in situ of skin of left lower limb, including hip: Secondary | ICD-10-CM | POA: Diagnosis not present

## 2020-01-30 DIAGNOSIS — L570 Actinic keratosis: Secondary | ICD-10-CM | POA: Diagnosis not present

## 2020-01-30 DIAGNOSIS — Z85828 Personal history of other malignant neoplasm of skin: Secondary | ICD-10-CM | POA: Diagnosis not present

## 2020-04-03 DIAGNOSIS — Z961 Presence of intraocular lens: Secondary | ICD-10-CM | POA: Diagnosis not present

## 2020-05-04 DIAGNOSIS — D485 Neoplasm of uncertain behavior of skin: Secondary | ICD-10-CM | POA: Diagnosis not present

## 2020-05-04 DIAGNOSIS — Z85828 Personal history of other malignant neoplasm of skin: Secondary | ICD-10-CM | POA: Diagnosis not present

## 2020-05-04 DIAGNOSIS — C44729 Squamous cell carcinoma of skin of left lower limb, including hip: Secondary | ICD-10-CM | POA: Diagnosis not present

## 2020-05-04 DIAGNOSIS — D0472 Carcinoma in situ of skin of left lower limb, including hip: Secondary | ICD-10-CM | POA: Diagnosis not present

## 2020-05-04 DIAGNOSIS — L820 Inflamed seborrheic keratosis: Secondary | ICD-10-CM | POA: Diagnosis not present

## 2020-05-04 DIAGNOSIS — L570 Actinic keratosis: Secondary | ICD-10-CM | POA: Diagnosis not present

## 2020-05-11 ENCOUNTER — Ambulatory Visit
Admission: RE | Admit: 2020-05-11 | Discharge: 2020-05-11 | Disposition: A | Discharge status: Home / Self Care | Payer: PPO | Source: Ambulatory Visit | Attending: Family Medicine | Admitting: Family Medicine

## 2020-05-11 ENCOUNTER — Other Ambulatory Visit: Payer: Self-pay

## 2020-05-11 DIAGNOSIS — Z13820 Encounter for screening for osteoporosis: Secondary | ICD-10-CM | POA: Diagnosis not present

## 2020-05-11 DIAGNOSIS — E2839 Other primary ovarian failure: Secondary | ICD-10-CM

## 2020-05-11 DIAGNOSIS — Z1231 Encounter for screening mammogram for malignant neoplasm of breast: Secondary | ICD-10-CM

## 2020-07-24 DIAGNOSIS — N1832 Chronic kidney disease, stage 3b: Secondary | ICD-10-CM | POA: Diagnosis not present

## 2020-07-24 DIAGNOSIS — E1122 Type 2 diabetes mellitus with diabetic chronic kidney disease: Secondary | ICD-10-CM | POA: Diagnosis not present

## 2020-07-24 DIAGNOSIS — Z6835 Body mass index (BMI) 35.0-35.9, adult: Secondary | ICD-10-CM | POA: Diagnosis not present

## 2020-07-24 DIAGNOSIS — N2581 Secondary hyperparathyroidism of renal origin: Secondary | ICD-10-CM | POA: Diagnosis not present

## 2020-07-24 DIAGNOSIS — E785 Hyperlipidemia, unspecified: Secondary | ICD-10-CM | POA: Diagnosis not present

## 2020-07-24 DIAGNOSIS — I129 Hypertensive chronic kidney disease with stage 1 through stage 4 chronic kidney disease, or unspecified chronic kidney disease: Secondary | ICD-10-CM | POA: Diagnosis not present

## 2020-08-21 IMAGING — MG DIGITAL SCREENING BILATERAL MAMMOGRAM WITH TOMO AND CAD
8 series · 8 of 24 positions shown · non-contrast
Comparison: Previous exam(s).

CLINICAL DATA: Screening.

EXAM:
DIGITAL SCREENING BILATERAL MAMMOGRAM WITH TOMO AND CAD

[L MLO synth-2D]
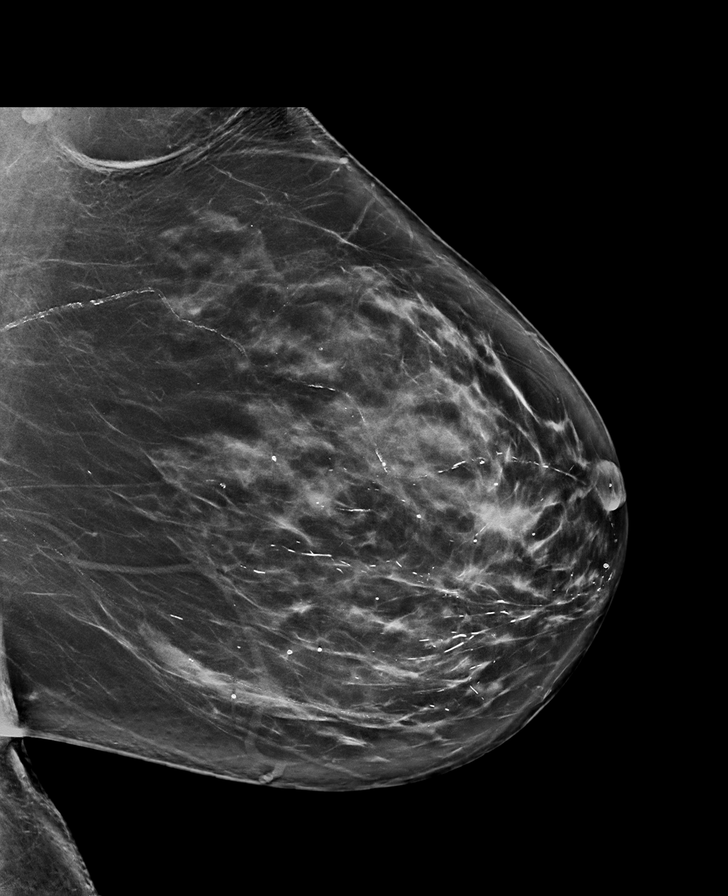

[R MLO synth-2D]
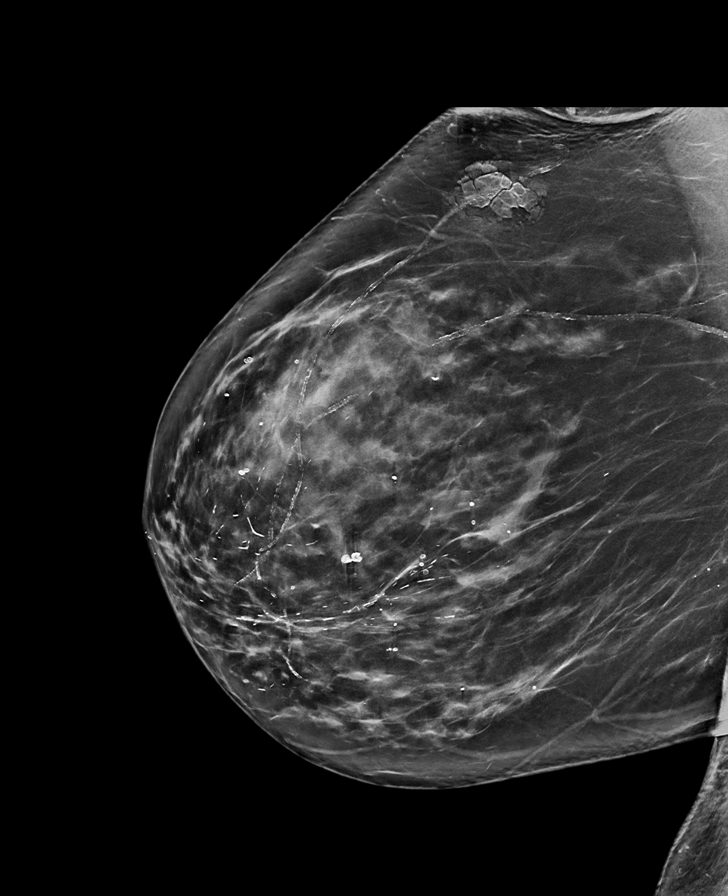

[L CC synth-2D]
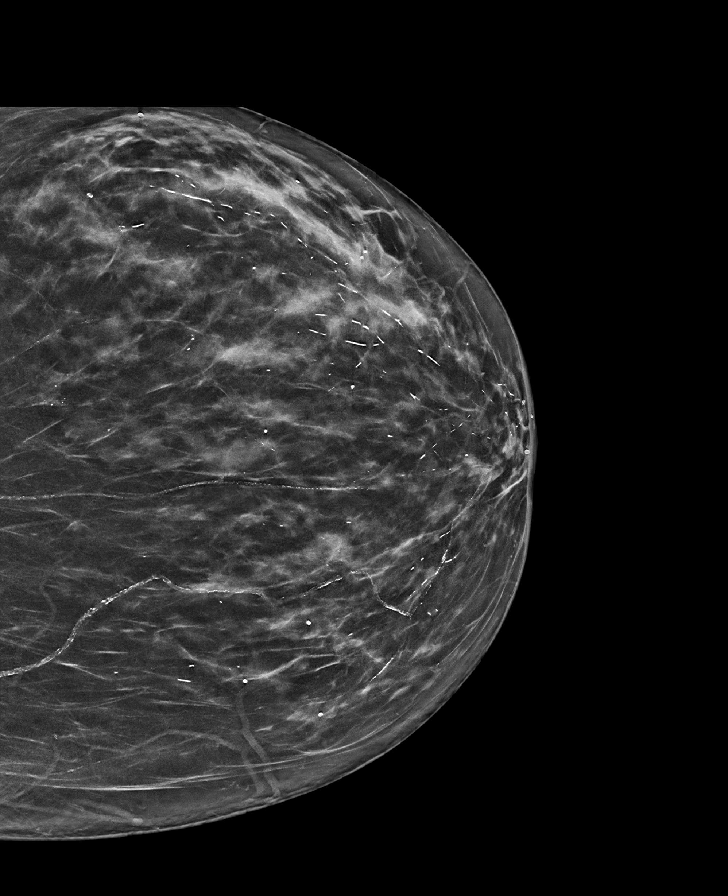

[R CC synth-2D]
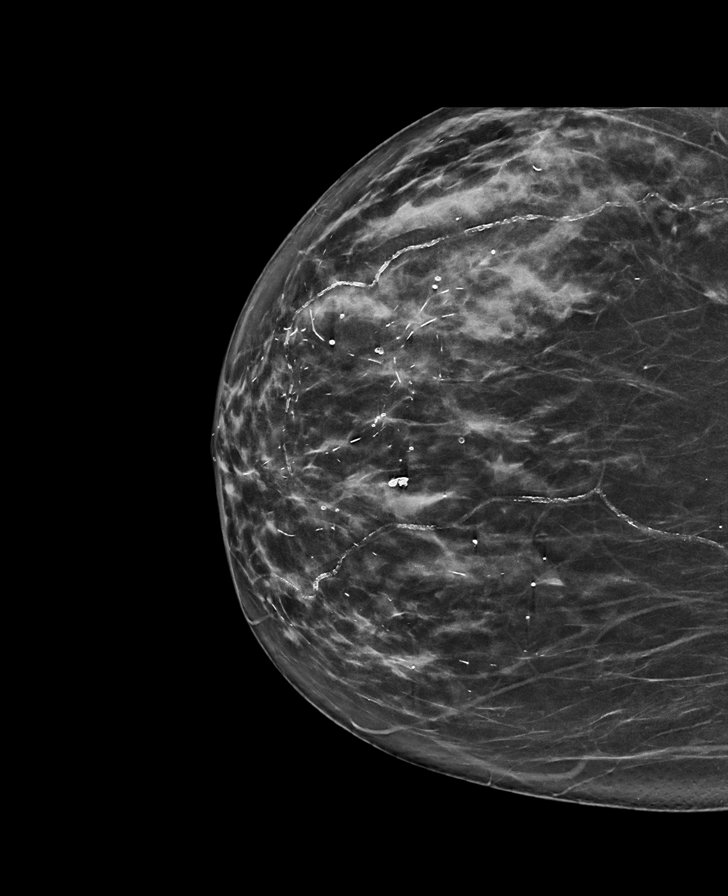

[R MLO tomo · tomo slice 45/90.0]
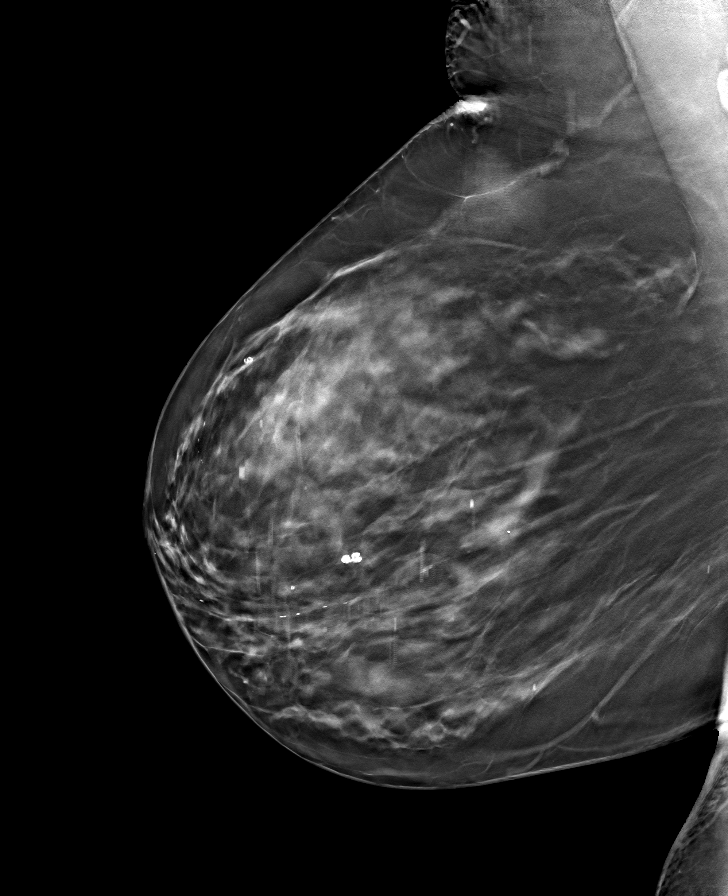

[L CC tomo · tomo slice 37/72.0]
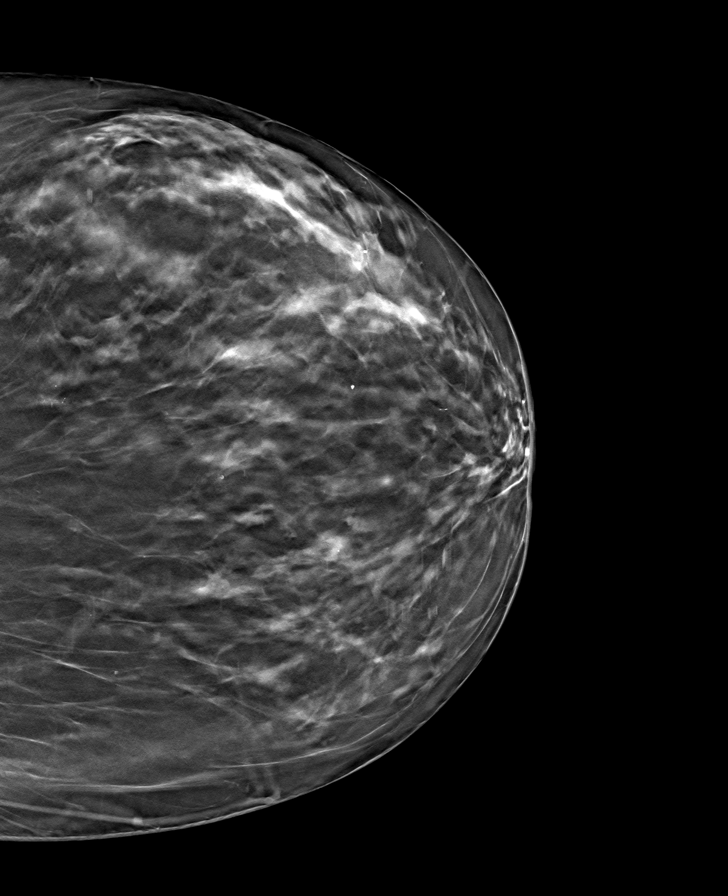

[L MLO tomo · tomo slice 47/93.0]
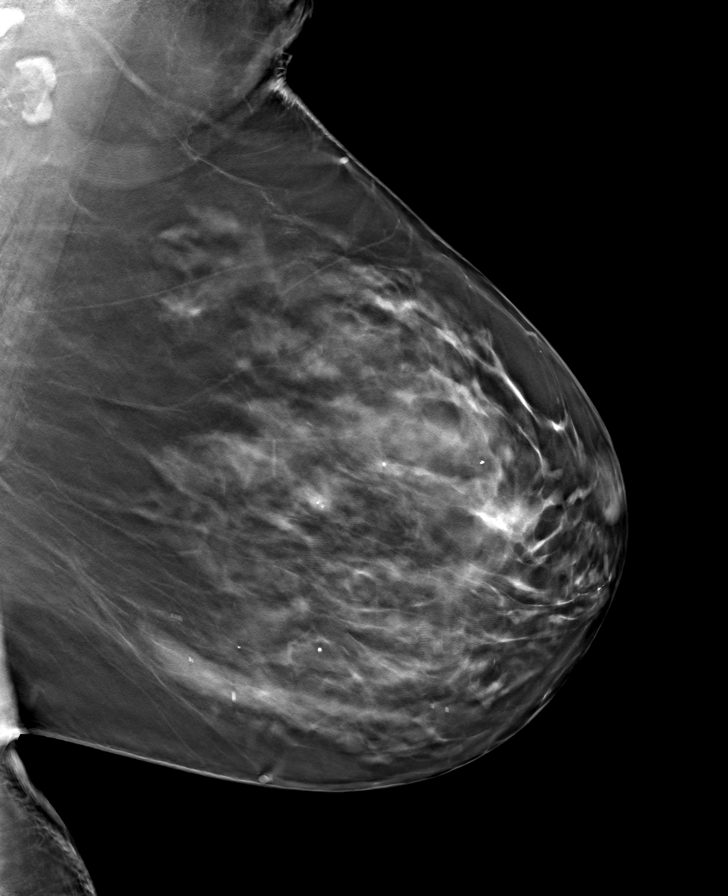

[R CC tomo · tomo slice 37/73.0]
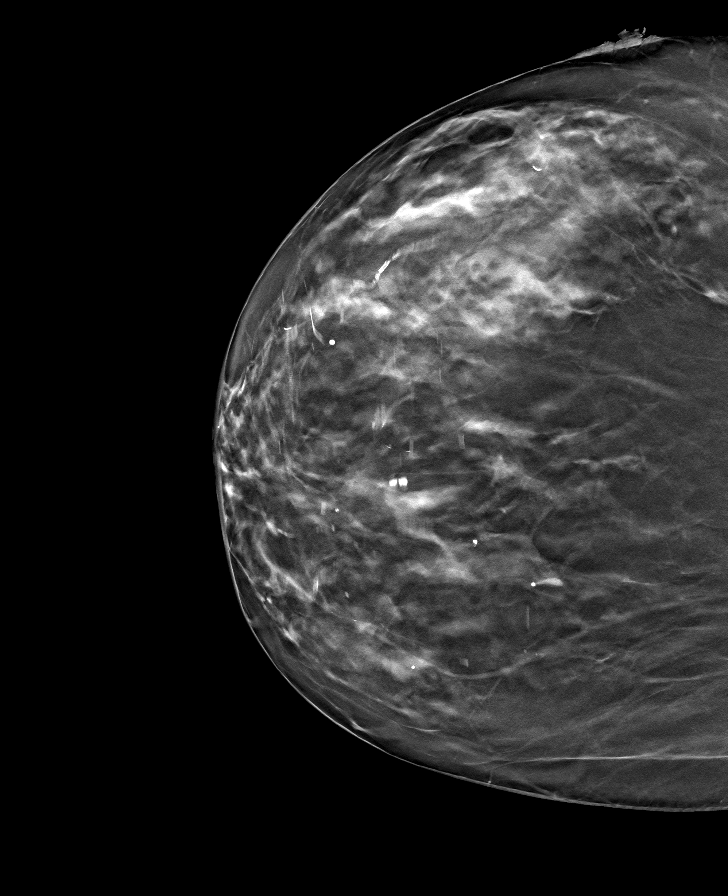

[8 of 24 positions shown; findings below may reference images not displayed]

ACR Breast Density Category c: The breast tissue is heterogeneously
dense, which may obscure small masses.
FINDINGS: There are no findings suspicious for malignancy. Images were
processed with CAD.
IMPRESSION: No mammographic evidence of malignancy. A result letter of this
screening mammogram will be mailed directly to the patient.

RECOMMENDATION:
Screening mammogram in one year. (Code:FT-U-LHB)

BI-RADS CATEGORY  1: Negative.

## 2021-01-12 ENCOUNTER — Other Ambulatory Visit: Payer: Self-pay

## 2021-01-12 ENCOUNTER — Emergency Department (HOSPITAL_COMMUNITY)
Admission: EM | Admit: 2021-01-12 | Discharge: 2021-01-12 | Disposition: A | Discharge status: Home / Self Care | Payer: PPO | Attending: Emergency Medicine | Admitting: Emergency Medicine

## 2021-01-12 ENCOUNTER — Encounter (HOSPITAL_COMMUNITY): Payer: Self-pay | Admitting: Emergency Medicine

## 2021-01-12 ENCOUNTER — Emergency Department (HOSPITAL_COMMUNITY): Payer: PPO

## 2021-01-12 DIAGNOSIS — Z79899 Other long term (current) drug therapy: Secondary | ICD-10-CM | POA: Insufficient documentation

## 2021-01-12 DIAGNOSIS — R1031 Right lower quadrant pain: Secondary | ICD-10-CM | POA: Diagnosis not present

## 2021-01-12 DIAGNOSIS — R1110 Vomiting, unspecified: Secondary | ICD-10-CM | POA: Insufficient documentation

## 2021-01-12 DIAGNOSIS — I129 Hypertensive chronic kidney disease with stage 1 through stage 4 chronic kidney disease, or unspecified chronic kidney disease: Secondary | ICD-10-CM | POA: Insufficient documentation

## 2021-01-12 DIAGNOSIS — N183 Chronic kidney disease, stage 3 unspecified: Secondary | ICD-10-CM | POA: Diagnosis not present

## 2021-01-12 DIAGNOSIS — R1111 Vomiting without nausea: Secondary | ICD-10-CM | POA: Diagnosis not present

## 2021-01-12 DIAGNOSIS — J45909 Unspecified asthma, uncomplicated: Secondary | ICD-10-CM | POA: Insufficient documentation

## 2021-01-12 DIAGNOSIS — I700 Atherosclerosis of aorta: Secondary | ICD-10-CM | POA: Diagnosis not present

## 2021-01-12 DIAGNOSIS — R1033 Periumbilical pain: Secondary | ICD-10-CM | POA: Diagnosis not present

## 2021-01-12 DIAGNOSIS — R112 Nausea with vomiting, unspecified: Secondary | ICD-10-CM | POA: Diagnosis not present

## 2021-01-12 DIAGNOSIS — R109 Unspecified abdominal pain: Secondary | ICD-10-CM | POA: Diagnosis not present

## 2021-01-12 DIAGNOSIS — Z90710 Acquired absence of both cervix and uterus: Secondary | ICD-10-CM | POA: Diagnosis not present

## 2021-01-12 DIAGNOSIS — Z85828 Personal history of other malignant neoplasm of skin: Secondary | ICD-10-CM | POA: Diagnosis not present

## 2021-01-12 LAB — COMPREHENSIVE METABOLIC PANEL
ALT: 29 U/L (ref 0–44)
AST: 36 U/L (ref 15–41)
Albumin: 4 g/dL (ref 3.5–5.0)
Alkaline Phosphatase: 92 U/L (ref 38–126)
Anion gap: 12 (ref 5–15)
BUN: 38 mg/dL — ABNORMAL HIGH (ref 8–23)
CO2: 26 mmol/L (ref 22–32)
Calcium: 10 mg/dL (ref 8.9–10.3)
Chloride: 99 mmol/L (ref 98–111)
Creatinine, Ser: 1.84 mg/dL — ABNORMAL HIGH (ref 0.44–1.00)
GFR, Estimated: 28 mL/min — ABNORMAL LOW (ref >60)
Glucose, Bld: 130 mg/dL — ABNORMAL HIGH (ref 70–99)
Potassium: 3.9 mmol/L (ref 3.5–5.1)
Sodium: 137 mmol/L (ref 135–145)
Total Bilirubin: 1.1 mg/dL (ref 0.3–1.2)
Total Protein: 7.4 g/dL (ref 6.5–8.1)

## 2021-01-12 LAB — CBC WITH DIFFERENTIAL/PLATELET
Abs Immature Granulocytes: 0.05 10*3/uL (ref 0.00–0.07)
Basophils Absolute: 0 10*3/uL (ref 0.0–0.1)
Basophils Relative: 0 %
Eosinophils Absolute: 0.1 10*3/uL (ref 0.0–0.5)
Eosinophils Relative: 1 %
HCT: 44.7 % (ref 36.0–46.0)
Hemoglobin: 14.7 g/dL (ref 12.0–15.0)
Immature Granulocytes: 1 %
Lymphocytes Relative: 10 %
Lymphs Abs: 1 10*3/uL (ref 0.7–4.0)
MCH: 31.6 pg (ref 26.0–34.0)
MCHC: 32.9 g/dL (ref 30.0–36.0)
MCV: 96.1 fL (ref 80.0–100.0)
Monocytes Absolute: 0.6 10*3/uL (ref 0.1–1.0)
Monocytes Relative: 6 %
Neutro Abs: 8.4 10*3/uL — ABNORMAL HIGH (ref 1.7–7.7)
Neutrophils Relative %: 82 %
Platelets: 262 10*3/uL (ref 150–400)
RBC: 4.65 MIL/uL (ref 3.87–5.11)
RDW: 12.3 % (ref 11.5–15.5)
WBC: 10.2 10*3/uL (ref 4.0–10.5)
nRBC: 0 % (ref 0.0–0.2)

## 2021-01-12 LAB — LIPASE, BLOOD: Lipase: 33 U/L (ref 11–51)

## 2021-01-12 MED ORDER — ONDANSETRON 4 MG PO TBDP: 4.0000 mg | ORAL_TABLET | Freq: Three times a day (TID) | ORAL | 0 refills | Status: AC | PRN

## 2021-01-12 MED ORDER — SODIUM CHLORIDE 0.9 % IV BOLUS: 1000.0000 mL | Freq: Once | INTRAVENOUS | Status: DC

## 2021-01-12 NOTE — ED Triage Notes (Signed)
Pt c/o right sided abdominal pain that radiates to her back x 1 week. C/o nausea/vomiting.

## 2021-01-12 NOTE — ED Notes (Signed)
Patient transported to CT 

## 2021-01-12 NOTE — Discharge Instructions (Signed)
Please read and follow all provided instructions.  Your diagnoses today include:  1. Right lower quadrant abdominal pain   2. Non-intractable vomiting without nausea, unspecified vomiting type     Tests performed today include: Blood cell counts and platelets Kidney and liver function tests - creatinine was 1.84 today CT scan of the abdomen and pelvis - did not show any concerning findings Vital signs. See below for your results today.   Medications prescribed:  Zofran (ondansetron) - for nausea and vomiting  Take any prescribed medications only as directed.  Home care instructions:  Follow any educational materials contained in this packet.  Follow-up instructions: Please follow-up with your primary care provider in the next 3 days for further evaluation of your symptoms.    Return instructions:  SEEK IMMEDIATE MEDICAL ATTENTION IF: The pain does not go away or becomes severe  A temperature above 101F develops  Repeated vomiting occurs (multiple episodes)  The pain becomes localized to portions of the abdomen. The right side could possibly be appendicitis. In an adult, the left lower portion of the abdomen could be colitis or diverticulitis.  Blood is being passed in stools or vomit (bright red or black tarry stools)  You develop chest pain, difficulty breathing, dizziness or fainting, or become confused, poorly responsive, or inconsolable (young children) If you have any other emergent concerns regarding your health  Additional Information: Abdominal (belly) pain can be caused by many things. Your caregiver performed an examination and possibly ordered blood/urine tests and imaging (CT scan, x-rays, ultrasound). Many cases can be observed and treated at home after initial evaluation in the emergency department. Even though you are being discharged home, abdominal pain can be unpredictable. Therefore, you need a repeated exam if your pain does not resolve, returns, or worsens.  Most patients with abdominal pain don't have to be admitted to the hospital or have surgery, but serious problems like appendicitis and gallbladder attacks can start out as nonspecific pain. Many abdominal conditions cannot be diagnosed in one visit, so follow-up evaluations are very important.  Your vital signs today were: BP (!) 102/50   Pulse 66   Temp 98.6 F (37 C) (Oral)   Resp 15   SpO2 99%  If your blood pressure (bp) was elevated above 135/85 this visit, please have this repeated by your doctor within one month. --------------

## 2021-01-12 NOTE — ED Provider Notes (Addendum)
Emergency Medicine Provider Triage Evaluation Note  Caitlin Potts , a 80 y.o. female  was evaluated in triage.  Pt complains of RLQ abd pain for several days, nv started last night with chills.  Review of Systems  Positive: Abd pain, nv, chills Negative: diarrhea  Physical Exam  BP 102/63   Pulse 73   Temp 98.6 F (37 C) (Oral)   Resp 14   SpO2 99%  Gen:   Awake, no distress   Resp:  Normal effort  MSK:   Moves extremities without difficulty  Other:  Ttp to the rlq  Medical Decision Making  Medically screening exam initiated at 3:00 PM.  Appropriate orders placed.  Providence Crosby was informed that the remainder of the evaluation will be completed by another provider, this initial triage assessment does not replace that evaluation, and the importance of remaining in the ED until their evaluation is complete.  Pt declined repeat UA as she said she had one at her Jannetta Quint, PA-C 01/12/21 1501    Rodney Booze, PA-C 01/12/21 1507    Wyvonnia Dusky, MD 01/12/21 1745

## 2021-01-12 NOTE — ED Provider Notes (Signed)
[facility] EMERGENCY DEPARTMENT Provider Note   CSN: [id number] Arrival date & time: 01/12/21  1423     History Chief Complaint  Patient presents with   Abdominal Pain    Caitlin Potts is a 80 y.o. female.  Patient with history of chronic kidney disease presents the emergency department today for evaluation of right-sided abdominal pain and vomiting.  Patient states that she has had abdominal pain on the right side for about 10 days.  Symptoms overall have been mild.  They are worse with certain positions.  She denies associated chest pain or shortness of breath.  No fever or chills.  No diarrhea or constipation.  No urinary symptoms.  Pain does not radiate.  Last night she developed vomiting which was persistent during the overnight hours and improved during the day today.  No treatments prior to arrival.  She went to her primary care doctor who checked a plain film which showed "partial ileus".  She is referred to the emergency department for CT imaging and further evaluation.  Denies surgical history except for hysterectomy.      Past Medical History:  Diagnosis Date   Asthma    Cervical radiculopathy    Chronic kidney disease    stage III   Eczema    Elbow fracture, right    Fatty infiltration of liver    Greater trochanteric bursitis of left hip    Hyperlipidemia    Hypertension    Plantar fasciitis    Rotator cuff strain    SCCA (squamous cell carcinoma) of skin    Skin cancer     Patient Active Problem List   Diagnosis Date Noted   HTN (hypertension) 03/12/2015   Pulmonary HTN ([place]) 03/12/2015   Chest pain 12/11/2014   Claudication ([place]) 12/11/2014   DOE (dyspnea on exertion) 12/11/2014    History reviewed. No pertinent surgical history.   OB History   No obstetric history on file.     Family History  Problem Relation Age of Onset   Parkinsonism Mother    CVA Father    Heart disease Sister    Lung cancer Brother    Parkinsonism  Brother    Breast cancer Maternal Aunt     Social History   Tobacco Use   Smoking status: Never   Smokeless tobacco: Never  Substance Use Topics   Alcohol use: No    Alcohol/week: 0.0 standard drinks   Drug use: No    Home Medications Prior to Admission medications   Medication Sig Start Date End Date Taking? Authorizing Provider  albuterol (PROVENTIL HFA;VENTOLIN HFA) 108 (90 BASE) MCG/ACT inhaler Inhale 2 puffs into the lungs every 4 (four) hours as needed for wheezing or shortness of breath.    [provider]  amLODipine (NORVASC) 5 MG tablet TAKE 1 TABLET BY MOUTH DAILY AFTER SUPPER Patient overdue for an appointment, must call and schedule for further refills 3rd/final attempt 05/30/17   Dorothy Spark, MD  atorvastatin (LIPITOR) 10 MG tablet Take 10 mg by mouth daily.    [provider]  Calcium 600-200 MG-UNIT per tablet Take 1 tablet by mouth 2 (two) times daily.    [provider]  diclofenac sodium (VOLTAREN) 1 % GEL Apply 4 g topically 4 (four) times daily. 10/04/16   Landis Martins, DPM  fluticasone (FLOVENT HFA) 220 MCG/ACT inhaler Inhale 2 puffs into the lungs 2 (two) times daily.    [provider]  furosemide (LASIX) 20  MG tablet Take 20 mg by mouth as needed. FOR SWELLING    [provider]  lisinopril-hydrochlorothiazide (PRINZIDE,ZESTORETIC) 20-25 MG per tablet Take 1 tablet by mouth daily.    [provider]  metoprolol succinate (TOPROL-XL) 50 MG 24 hr tablet Take 50 mg by mouth daily. Take with or immediately following a meal.    [provider]  Multiple Vitamins-Minerals (MULTIVITAMIN WITH MINERALS) tablet Take 1 tablet by mouth daily.    [provider]  omeprazole (PRILOSEC) 20 MG capsule Take 20 mg by mouth daily.    [provider]    Allergies    Penicillins and Sulfa antibiotics  Review of Systems   Review of Systems  Constitutional:  Negative for fever.  HENT:   Negative for rhinorrhea and sore throat.   Eyes:  Negative for redness.  Respiratory:  Negative for cough.   Cardiovascular:  Negative for chest pain.  Gastrointestinal:  Positive for abdominal pain, nausea and vomiting. Negative for diarrhea.  Genitourinary:  Negative for dysuria, frequency, hematuria and urgency.  Musculoskeletal:  Negative for myalgias.  Skin:  Negative for rash.  Neurological:  Negative for headaches.   Physical Exam Updated Vital Signs BP 102/63   Pulse 73   Temp 98.6 F (37 C) (Oral)   Resp 14   SpO2 99%   Physical Exam Vitals and nursing note reviewed.  Constitutional:      General: She is not in acute distress.    Appearance: She is well-developed.  HENT:     Head: Normocephalic and atraumatic.     Right Ear: External ear normal.     Left Ear: External ear normal.     Nose: Nose normal.  Eyes:     Conjunctiva/sclera: Conjunctivae normal.  Cardiovascular:     Rate and Rhythm: Normal rate and regular rhythm.     Heart sounds: No murmur heard. Pulmonary:     Effort: No respiratory distress.     Breath sounds: No wheezing, rhonchi or rales.  Abdominal:     Palpations: Abdomen is soft.     Tenderness: There is abdominal tenderness (mild) in the right lower quadrant and periumbilical area. There is no guarding or rebound.  Musculoskeletal:     Cervical back: Normal range of motion and neck supple.     Right lower leg: No edema.     Left lower leg: No edema.  Skin:    General: Skin is warm and dry.     Findings: No rash.  Neurological:     General: No focal deficit present.     Mental Status: She is alert. Mental status is at baseline.     Motor: No weakness.  Psychiatric:        Mood and Affect: Mood normal.    ED Results / Procedures / Treatments   Labs (all labs ordered are listed, but only abnormal results are displayed) Labs Reviewed  CBC WITH DIFFERENTIAL/PLATELET - Abnormal; Notable for the following components:      Result Value    Neutro Abs 8.4 (*)    All other components within normal limits  COMPREHENSIVE METABOLIC PANEL - Abnormal; Notable for the following components:   Glucose, Bld 130 (*)    BUN 38 (*)    Creatinine, Ser 1.84 (*)    GFR, Estimated 28 (*)    All other components within normal limits  LIPASE, BLOOD  URINALYSIS, ROUTINE W REFLEX MICROSCOPIC    EKG None  Radiology CT ABDOMEN PELVIS  WO CONTRAST  Result Date: 01/12/2021 CLINICAL DATA:  Right lower quadrant pain EXAM: CT ABDOMEN AND PELVIS WITHOUT CONTRAST TECHNIQUE: Multidetector CT imaging of the abdomen and pelvis was performed following the standard protocol without IV contrast. COMPARISON:  None. FINDINGS: LOWER CHEST: Normal. HEPATOBILIARY: Normal hepatic contours. No intra- or extrahepatic biliary dilatation. The gallbladder is normal. PANCREAS: Normal pancreas. No ductal dilatation or peripancreatic fluid collection. SPLEEN: Normal. ADRENALS/URINARY TRACT: The adrenal glands are normal. No hydronephrosis, nephroureterolithiasis or solid renal mass. The urinary bladder is normal for degree of distention STOMACH/BOWEL: There is no hiatal hernia. Normal duodenal course and caliber. No small bowel dilatation or inflammation. No focal colonic abnormality. Normal appendix. VASCULAR/LYMPHATIC: There is calcific atherosclerosis of the abdominal aorta. No lymphadenopathy. REPRODUCTIVE: Status post hysterectomy. No adnexal mass. MUSCULOSKELETAL. Multilevel degenerative disc disease and facet arthrosis. No bony spinal canal stenosis. OTHER: None. IMPRESSION: 1. No acute abnormality of the abdomen or pelvis. Aortic Atherosclerosis (ICD10-I70.0). Electronically Signed   By: Ulyses Jarred M.D.   On: 01/12/2021 20:34    Procedures Procedures   Medications Ordered in ED Medications  sodium chloride 0.9 % bolus 1,000 mL (has no administration in time range)    ED Course  I have reviewed the triage vital signs and the nursing notes.  Pertinent labs &  imaging results that were available during my care of the patient were reviewed by me and considered in my medical decision making (see chart for details).  Patient seen and examined. Work-up reviewed.  Creatinine elevated consistent with history of chronic kidney disease, although no labs in computer for comparison.  Noncontrast CT ordered.  Patient looks nontoxic.  Mild leukocytosis.  Vital signs reviewed and are as follows: BP 102/63   Pulse 73   Temp 98.6 F (37 C) (Oral)   Resp 14   SpO2 99%   8:38 PM CT reviewed, no findings.  Patient updated.  She continues to do well.  No vomiting.  She states that she is hungry.  I did ask if she knew what her baseline creatinine was, she does not.  She states that she follows with nephrology every year to have her kidney function checked.  The patient was urged to return to the Emergency Department immediately with worsening of current symptoms, worsening abdominal pain, persistent vomiting, blood noted in stools, fever, or any other concerns. The patient verbalized understanding.     MDM Rules/Calculators/A&P                           Patient with abdominal pain, vomiting today/resolved.  Sent for CT scan to evaluate for bowel obstruction or other etiology given abnormal plain film at her doctor's office.  Vitals are stable, no fever. Labs with elevated creatinine, likely chronic due to history of CKD. Imaging CT reassuring without acute findings. No signs of dehydration, patient is tolerating PO's. Lungs are clear and no signs suggestive of PNA. Low concern for appendicitis, cholecystitis, pancreatitis, ruptured viscus, UTI, kidney stone, aortic dissection, aortic aneurysm or other emergent abdominal etiology. Supportive therapy indicated with return if symptoms worsen.     Final Clinical Impression(s) / ED Diagnoses Final diagnoses:  Right lower quadrant abdominal pain  Non-intractable vomiting without nausea, unspecified vomiting type     Rx / DC Orders ED Discharge Orders          Ordered    ondansetron (ZOFRAN ODT) 4 MG disintegrating tablet  Every 8 hours PRN  01/12/21 2050             Carlisle Cater, PA-C 01/12/21 2053    Margette Fast, MD 01/13/21 1128

## 2021-02-09 DIAGNOSIS — J45909 Unspecified asthma, uncomplicated: Secondary | ICD-10-CM | POA: Diagnosis not present

## 2021-02-09 DIAGNOSIS — N184 Chronic kidney disease, stage 4 (severe): Secondary | ICD-10-CM | POA: Diagnosis not present

## 2021-02-09 DIAGNOSIS — I129 Hypertensive chronic kidney disease with stage 1 through stage 4 chronic kidney disease, or unspecified chronic kidney disease: Secondary | ICD-10-CM | POA: Diagnosis not present

## 2021-02-09 DIAGNOSIS — Z0000 Encounter for general adult medical examination without abnormal findings: Secondary | ICD-10-CM | POA: Diagnosis not present

## 2021-02-09 DIAGNOSIS — I700 Atherosclerosis of aorta: Secondary | ICD-10-CM | POA: Diagnosis not present

## 2021-02-09 DIAGNOSIS — E785 Hyperlipidemia, unspecified: Secondary | ICD-10-CM | POA: Diagnosis not present

## 2021-02-09 DIAGNOSIS — K219 Gastro-esophageal reflux disease without esophagitis: Secondary | ICD-10-CM | POA: Diagnosis not present

## 2021-02-09 DIAGNOSIS — Z1389 Encounter for screening for other disorder: Secondary | ICD-10-CM | POA: Diagnosis not present

## 2021-02-09 DIAGNOSIS — E1122 Type 2 diabetes mellitus with diabetic chronic kidney disease: Secondary | ICD-10-CM | POA: Diagnosis not present

## 2021-04-09 DIAGNOSIS — H353111 Nonexudative age-related macular degeneration, right eye, early dry stage: Secondary | ICD-10-CM | POA: Diagnosis not present

## 2021-04-09 DIAGNOSIS — H26493 Other secondary cataract, bilateral: Secondary | ICD-10-CM | POA: Diagnosis not present

## 2021-04-09 DIAGNOSIS — H52203 Unspecified astigmatism, bilateral: Secondary | ICD-10-CM | POA: Diagnosis not present

## 2021-04-19 DIAGNOSIS — M109 Gout, unspecified: Secondary | ICD-10-CM | POA: Diagnosis not present

## 2021-05-05 DIAGNOSIS — S82134A Nondisplaced fracture of medial condyle of right tibia, initial encounter for closed fracture: Secondary | ICD-10-CM | POA: Diagnosis not present

## 2021-05-05 DIAGNOSIS — M25571 Pain in right ankle and joints of right foot: Secondary | ICD-10-CM | POA: Diagnosis not present

## 2021-07-15 DIAGNOSIS — E785 Hyperlipidemia, unspecified: Secondary | ICD-10-CM | POA: Diagnosis not present

## 2021-07-15 DIAGNOSIS — G8929 Other chronic pain: Secondary | ICD-10-CM | POA: Diagnosis not present

## 2021-07-15 DIAGNOSIS — E669 Obesity, unspecified: Secondary | ICD-10-CM | POA: Diagnosis not present

## 2021-07-15 DIAGNOSIS — M545 Low back pain, unspecified: Secondary | ICD-10-CM | POA: Diagnosis not present

## 2021-07-15 DIAGNOSIS — I10 Essential (primary) hypertension: Secondary | ICD-10-CM | POA: Diagnosis not present

## 2021-07-15 DIAGNOSIS — E1122 Type 2 diabetes mellitus with diabetic chronic kidney disease: Secondary | ICD-10-CM | POA: Diagnosis not present

## 2021-07-20 ENCOUNTER — Other Ambulatory Visit: Payer: Self-pay | Admitting: Internal Medicine

## 2021-07-20 DIAGNOSIS — Z1231 Encounter for screening mammogram for malignant neoplasm of breast: Secondary | ICD-10-CM

## 2021-07-27 DIAGNOSIS — N2581 Secondary hyperparathyroidism of renal origin: Secondary | ICD-10-CM | POA: Diagnosis not present

## 2021-07-27 DIAGNOSIS — D631 Anemia in chronic kidney disease: Secondary | ICD-10-CM | POA: Diagnosis not present

## 2021-07-27 DIAGNOSIS — N183 Chronic kidney disease, stage 3 unspecified: Secondary | ICD-10-CM | POA: Diagnosis not present

## 2021-07-27 DIAGNOSIS — I129 Hypertensive chronic kidney disease with stage 1 through stage 4 chronic kidney disease, or unspecified chronic kidney disease: Secondary | ICD-10-CM | POA: Diagnosis not present

## 2021-08-03 ENCOUNTER — Ambulatory Visit
Admission: RE | Admit: 2021-08-03 | Discharge: 2021-08-03 | Disposition: A | Discharge status: Home / Self Care | Payer: PPO | Source: Ambulatory Visit | Attending: Internal Medicine | Admitting: Internal Medicine

## 2021-08-03 DIAGNOSIS — Z1231 Encounter for screening mammogram for malignant neoplasm of breast: Secondary | ICD-10-CM

## 2021-09-22 DIAGNOSIS — D2239 Melanocytic nevi of other parts of face: Secondary | ICD-10-CM | POA: Diagnosis not present

## 2021-09-22 DIAGNOSIS — L821 Other seborrheic keratosis: Secondary | ICD-10-CM | POA: Diagnosis not present

## 2021-09-22 DIAGNOSIS — L820 Inflamed seborrheic keratosis: Secondary | ICD-10-CM | POA: Diagnosis not present

## 2021-09-22 DIAGNOSIS — L570 Actinic keratosis: Secondary | ICD-10-CM | POA: Diagnosis not present

## 2021-09-22 DIAGNOSIS — D0471 Carcinoma in situ of skin of right lower limb, including hip: Secondary | ICD-10-CM | POA: Diagnosis not present

## 2021-09-22 DIAGNOSIS — Z85828 Personal history of other malignant neoplasm of skin: Secondary | ICD-10-CM | POA: Diagnosis not present

## 2021-09-22 DIAGNOSIS — D0461 Carcinoma in situ of skin of right upper limb, including shoulder: Secondary | ICD-10-CM | POA: Diagnosis not present

## 2021-09-22 DIAGNOSIS — D0472 Carcinoma in situ of skin of left lower limb, including hip: Secondary | ICD-10-CM | POA: Diagnosis not present

## 2021-09-22 DIAGNOSIS — D485 Neoplasm of uncertain behavior of skin: Secondary | ICD-10-CM | POA: Diagnosis not present

## 2022-01-24 DIAGNOSIS — E1169 Type 2 diabetes mellitus with other specified complication: Secondary | ICD-10-CM | POA: Diagnosis not present

## 2022-01-24 DIAGNOSIS — N1832 Chronic kidney disease, stage 3b: Secondary | ICD-10-CM | POA: Diagnosis not present

## 2022-01-24 DIAGNOSIS — M722 Plantar fascial fibromatosis: Secondary | ICD-10-CM | POA: Diagnosis not present

## 2022-01-24 DIAGNOSIS — R300 Dysuria: Secondary | ICD-10-CM | POA: Diagnosis not present

## 2022-01-24 DIAGNOSIS — F32A Depression, unspecified: Secondary | ICD-10-CM | POA: Diagnosis not present

## 2022-01-24 DIAGNOSIS — I129 Hypertensive chronic kidney disease with stage 1 through stage 4 chronic kidney disease, or unspecified chronic kidney disease: Secondary | ICD-10-CM | POA: Diagnosis not present

## 2022-01-24 DIAGNOSIS — Z0000 Encounter for general adult medical examination without abnormal findings: Secondary | ICD-10-CM | POA: Diagnosis not present

## 2022-02-07 DIAGNOSIS — D225 Melanocytic nevi of trunk: Secondary | ICD-10-CM | POA: Diagnosis not present

## 2022-02-07 DIAGNOSIS — Z85828 Personal history of other malignant neoplasm of skin: Secondary | ICD-10-CM | POA: Diagnosis not present

## 2022-02-07 DIAGNOSIS — D0462 Carcinoma in situ of skin of left upper limb, including shoulder: Secondary | ICD-10-CM | POA: Diagnosis not present

## 2022-02-07 DIAGNOSIS — D485 Neoplasm of uncertain behavior of skin: Secondary | ICD-10-CM | POA: Diagnosis not present

## 2022-02-07 DIAGNOSIS — L821 Other seborrheic keratosis: Secondary | ICD-10-CM | POA: Diagnosis not present

## 2022-02-07 DIAGNOSIS — L570 Actinic keratosis: Secondary | ICD-10-CM | POA: Diagnosis not present

## 2022-02-07 DIAGNOSIS — D0471 Carcinoma in situ of skin of right lower limb, including hip: Secondary | ICD-10-CM | POA: Diagnosis not present

## 2022-04-29 DIAGNOSIS — H26493 Other secondary cataract, bilateral: Secondary | ICD-10-CM | POA: Diagnosis not present

## 2022-04-29 DIAGNOSIS — H52203 Unspecified astigmatism, bilateral: Secondary | ICD-10-CM | POA: Diagnosis not present

## 2022-04-29 DIAGNOSIS — H353111 Nonexudative age-related macular degeneration, right eye, early dry stage: Secondary | ICD-10-CM | POA: Diagnosis not present

## 2022-04-29 DIAGNOSIS — Z961 Presence of intraocular lens: Secondary | ICD-10-CM | POA: Diagnosis not present

## 2022-06-22 DIAGNOSIS — H26491 Other secondary cataract, right eye: Secondary | ICD-10-CM | POA: Diagnosis not present

## 2022-06-29 DIAGNOSIS — H26492 Other secondary cataract, left eye: Secondary | ICD-10-CM | POA: Diagnosis not present

## 2022-07-27 DIAGNOSIS — E1169 Type 2 diabetes mellitus with other specified complication: Secondary | ICD-10-CM | POA: Diagnosis not present

## 2022-07-27 DIAGNOSIS — I700 Atherosclerosis of aorta: Secondary | ICD-10-CM | POA: Diagnosis not present

## 2022-07-27 DIAGNOSIS — Z634 Disappearance and death of family member: Secondary | ICD-10-CM | POA: Diagnosis not present

## 2022-07-27 DIAGNOSIS — N1832 Chronic kidney disease, stage 3b: Secondary | ICD-10-CM | POA: Diagnosis not present

## 2022-07-27 DIAGNOSIS — E1122 Type 2 diabetes mellitus with diabetic chronic kidney disease: Secondary | ICD-10-CM | POA: Diagnosis not present

## 2022-07-27 DIAGNOSIS — I129 Hypertensive chronic kidney disease with stage 1 through stage 4 chronic kidney disease, or unspecified chronic kidney disease: Secondary | ICD-10-CM | POA: Diagnosis not present

## 2022-08-08 ENCOUNTER — Other Ambulatory Visit: Payer: Self-pay | Admitting: Internal Medicine

## 2022-08-08 DIAGNOSIS — Z1231 Encounter for screening mammogram for malignant neoplasm of breast: Secondary | ICD-10-CM

## 2022-08-10 DIAGNOSIS — I129 Hypertensive chronic kidney disease with stage 1 through stage 4 chronic kidney disease, or unspecified chronic kidney disease: Secondary | ICD-10-CM | POA: Diagnosis not present

## 2022-08-10 DIAGNOSIS — N2581 Secondary hyperparathyroidism of renal origin: Secondary | ICD-10-CM | POA: Diagnosis not present

## 2022-08-10 DIAGNOSIS — N183 Chronic kidney disease, stage 3 unspecified: Secondary | ICD-10-CM | POA: Diagnosis not present

## 2022-08-10 DIAGNOSIS — D631 Anemia in chronic kidney disease: Secondary | ICD-10-CM | POA: Diagnosis not present

## 2022-08-11 DIAGNOSIS — Z85828 Personal history of other malignant neoplasm of skin: Secondary | ICD-10-CM | POA: Diagnosis not present

## 2022-08-11 DIAGNOSIS — L821 Other seborrheic keratosis: Secondary | ICD-10-CM | POA: Diagnosis not present

## 2022-08-11 DIAGNOSIS — D485 Neoplasm of uncertain behavior of skin: Secondary | ICD-10-CM | POA: Diagnosis not present

## 2022-08-11 DIAGNOSIS — D0471 Carcinoma in situ of skin of right lower limb, including hip: Secondary | ICD-10-CM | POA: Diagnosis not present

## 2022-08-11 DIAGNOSIS — L570 Actinic keratosis: Secondary | ICD-10-CM | POA: Diagnosis not present

## 2022-09-20 ENCOUNTER — Ambulatory Visit
Admission: RE | Admit: 2022-09-20 | Discharge: 2022-09-20 | Disposition: A | Discharge status: Home / Self Care | Payer: PPO | Source: Ambulatory Visit | Attending: Internal Medicine | Admitting: Internal Medicine

## 2022-09-20 DIAGNOSIS — Z1231 Encounter for screening mammogram for malignant neoplasm of breast: Secondary | ICD-10-CM | POA: Diagnosis not present

## 2022-12-13 DIAGNOSIS — L821 Other seborrheic keratosis: Secondary | ICD-10-CM | POA: Diagnosis not present

## 2022-12-13 DIAGNOSIS — D485 Neoplasm of uncertain behavior of skin: Secondary | ICD-10-CM | POA: Diagnosis not present

## 2022-12-13 DIAGNOSIS — D225 Melanocytic nevi of trunk: Secondary | ICD-10-CM | POA: Diagnosis not present

## 2022-12-13 DIAGNOSIS — Z85828 Personal history of other malignant neoplasm of skin: Secondary | ICD-10-CM | POA: Diagnosis not present

## 2022-12-13 DIAGNOSIS — D0471 Carcinoma in situ of skin of right lower limb, including hip: Secondary | ICD-10-CM | POA: Diagnosis not present

## 2022-12-13 DIAGNOSIS — L570 Actinic keratosis: Secondary | ICD-10-CM | POA: Diagnosis not present

## 2022-12-26 DIAGNOSIS — M79671 Pain in right foot: Secondary | ICD-10-CM | POA: Diagnosis not present

## 2023-01-02 DIAGNOSIS — M79671 Pain in right foot: Secondary | ICD-10-CM | POA: Diagnosis not present

## 2023-02-14 DIAGNOSIS — E1169 Type 2 diabetes mellitus with other specified complication: Secondary | ICD-10-CM | POA: Diagnosis not present

## 2023-02-14 DIAGNOSIS — M79672 Pain in left foot: Secondary | ICD-10-CM | POA: Diagnosis not present

## 2023-02-14 DIAGNOSIS — N1832 Chronic kidney disease, stage 3b: Secondary | ICD-10-CM | POA: Diagnosis not present

## 2023-02-14 DIAGNOSIS — F5101 Primary insomnia: Secondary | ICD-10-CM | POA: Diagnosis not present

## 2023-02-14 DIAGNOSIS — R300 Dysuria: Secondary | ICD-10-CM | POA: Diagnosis not present

## 2023-02-14 DIAGNOSIS — Z79899 Other long term (current) drug therapy: Secondary | ICD-10-CM | POA: Diagnosis not present

## 2023-02-14 DIAGNOSIS — I700 Atherosclerosis of aorta: Secondary | ICD-10-CM | POA: Diagnosis not present

## 2023-02-14 DIAGNOSIS — I872 Venous insufficiency (chronic) (peripheral): Secondary | ICD-10-CM | POA: Diagnosis not present

## 2023-02-14 DIAGNOSIS — E785 Hyperlipidemia, unspecified: Secondary | ICD-10-CM | POA: Diagnosis not present

## 2023-02-14 DIAGNOSIS — I129 Hypertensive chronic kidney disease with stage 1 through stage 4 chronic kidney disease, or unspecified chronic kidney disease: Secondary | ICD-10-CM | POA: Diagnosis not present

## 2023-02-14 DIAGNOSIS — Z0000 Encounter for general adult medical examination without abnormal findings: Secondary | ICD-10-CM | POA: Diagnosis not present

## 2023-02-14 DIAGNOSIS — E1122 Type 2 diabetes mellitus with diabetic chronic kidney disease: Secondary | ICD-10-CM | POA: Diagnosis not present

## 2023-02-14 DIAGNOSIS — M79671 Pain in right foot: Secondary | ICD-10-CM | POA: Diagnosis not present

## 2023-03-02 ENCOUNTER — Ambulatory Visit: Payer: PPO | Admitting: Podiatry

## 2023-03-02 ENCOUNTER — Ambulatory Visit (INDEPENDENT_AMBULATORY_CARE_PROVIDER_SITE_OTHER): Payer: PPO

## 2023-03-02 ENCOUNTER — Encounter: Payer: Self-pay | Admitting: Podiatry

## 2023-03-02 VITALS — BP 179/68 | HR 53

## 2023-03-02 DIAGNOSIS — M2041 Other hammer toe(s) (acquired), right foot: Secondary | ICD-10-CM | POA: Diagnosis not present

## 2023-03-02 DIAGNOSIS — M779 Enthesopathy, unspecified: Secondary | ICD-10-CM

## 2023-03-02 DIAGNOSIS — M2042 Other hammer toe(s) (acquired), left foot: Secondary | ICD-10-CM | POA: Diagnosis not present

## 2023-03-02 MED ORDER — TRIAMCINOLONE ACETONIDE 10 MG/ML IJ SUSP
10.0000 mg | Freq: Once | INTRAMUSCULAR | Status: AC
  Administered 2023-03-02: 10 mg via INTRA_ARTICULAR

## 2023-03-02 NOTE — Progress Notes (Signed)
Subjective:   Patient ID: Caitlin Potts, female   DOB: 82 y.o.   MRN: [id number]   HPI Patient presents with a lot of pain dorsum of both feet states it has been very inflamed and sore.  States it has been going on for at least 5 years gradually getting worse over that time.  Patient does not smoke likes to be active   Review of Systems  All other systems reviewed and are negative.       Objective:  Physical Exam Vitals and nursing note reviewed.  Constitutional:      Appearance: She is well-developed.  Pulmonary:     Effort: Pulmonary effort is normal.  Musculoskeletal:        General: Normal range of motion.  Skin:    General: Skin is warm.  Neurological:     Mental Status: She is alert.     Neurovascular status intact muscle strength adequate range of motion adequate exquisite discomfort midtarsal joint bilateral with inflammation fluid around the area and pain with pressure.  Good digital perfusion well-oriented x 3 dorsal     Assessment:  Tendinitis with possibility for arthritis midtarsal joint bilateral painful with fluid buildup     Plan:  H NP reviewed recommended injections explained risk sterile prep injected the midtarsal joint dorsal tendon complex 3 mg Dexasone Kenalog 5 mg Xylocaine advised on heat therapy diclofenac cream reappoint to recheck if symptoms indicate  X-rays indicate suspicious signs of arthritis around this area hard to identify specifically

## 2023-06-02 ENCOUNTER — Ambulatory Visit: Payer: PPO | Admitting: Podiatry

## 2023-06-02 ENCOUNTER — Encounter: Payer: Self-pay | Admitting: Podiatry

## 2023-06-02 DIAGNOSIS — M779 Enthesopathy, unspecified: Secondary | ICD-10-CM | POA: Diagnosis not present

## 2023-06-02 MED ORDER — TRIAMCINOLONE ACETONIDE 10 MG/ML IJ SUSP
10.0000 mg | Freq: Once | INTRAMUSCULAR | Status: AC
  Administered 2023-06-02: 10 mg via INTRA_ARTICULAR

## 2023-06-08 NOTE — Progress Notes (Signed)
Subjective:   Patient ID: Caitlin Potts, female   DOB: 82 y.o.   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

## 2023-08-10 DIAGNOSIS — N2581 Secondary hyperparathyroidism of renal origin: Secondary | ICD-10-CM | POA: Diagnosis not present

## 2023-08-10 DIAGNOSIS — N183 Chronic kidney disease, stage 3 unspecified: Secondary | ICD-10-CM | POA: Diagnosis not present

## 2023-08-16 DIAGNOSIS — I129 Hypertensive chronic kidney disease with stage 1 through stage 4 chronic kidney disease, or unspecified chronic kidney disease: Secondary | ICD-10-CM | POA: Diagnosis not present

## 2023-08-16 DIAGNOSIS — R058 Other specified cough: Secondary | ICD-10-CM | POA: Diagnosis not present

## 2023-08-16 DIAGNOSIS — K219 Gastro-esophageal reflux disease without esophagitis: Secondary | ICD-10-CM | POA: Diagnosis not present

## 2023-08-16 DIAGNOSIS — N1832 Chronic kidney disease, stage 3b: Secondary | ICD-10-CM | POA: Diagnosis not present

## 2023-08-16 DIAGNOSIS — E1169 Type 2 diabetes mellitus with other specified complication: Secondary | ICD-10-CM | POA: Diagnosis not present

## 2023-08-25 DIAGNOSIS — R053 Chronic cough: Secondary | ICD-10-CM | POA: Diagnosis not present

## 2023-09-06 DIAGNOSIS — I129 Hypertensive chronic kidney disease with stage 1 through stage 4 chronic kidney disease, or unspecified chronic kidney disease: Secondary | ICD-10-CM | POA: Diagnosis not present

## 2023-09-06 DIAGNOSIS — R053 Chronic cough: Secondary | ICD-10-CM | POA: Diagnosis not present

## 2023-10-03 ENCOUNTER — Encounter: Payer: Self-pay | Admitting: Podiatry

## 2023-10-03 ENCOUNTER — Ambulatory Visit: Admitting: Podiatry

## 2023-10-03 DIAGNOSIS — M778 Other enthesopathies, not elsewhere classified: Secondary | ICD-10-CM

## 2023-10-03 DIAGNOSIS — M7752 Other enthesopathy of left foot: Secondary | ICD-10-CM

## 2023-10-03 DIAGNOSIS — M7751 Other enthesopathy of right foot: Secondary | ICD-10-CM

## 2023-10-03 NOTE — Progress Notes (Signed)
  Subjective:  Patient ID: Caitlin Potts, female    DOB: 16-Aug-1940,  MRN: [id number]  Chief Complaint  Patient presents with   Injections    Patient states she is here for Cortisone shots in bilateral feet     83 y.o. female presents with the above complaint. History confirmed with patient.  She has seen Dr. Celia Coles twice for this with injections done across the dorsal midfoot and extensor tendon complex.  This has been very helpful in reducing her pain and improving her function.  Last injection was in December 2024.  No adverse effects.  Objective:  Physical Exam: warm, good capillary refill, no trophic changes or ulcerative lesions, normal DP and PT pulses, normal sensory exam, and mild pain and tenderness over the dorsal midfoot and extensor tendons of the bilateral foot at the level of the navicular cuneiform and midtarsal joint  Assessment:   1. Extensor tendinitis of foot      Plan:  Patient was evaluated and treated and all questions answered.  Corticosteroid injection has been previously helpful in reducing pain and improving function.  She elected to proceed again with this today.  Risks and benefits discussed as well as possible adverse effects.  Following prep with alcohol the bilateral dorsal midfoot and extensor tendons and sheaths were injected with 10 mg Kenalog  4 mg dexamethasone  and 5 mg of Marcaine.  She tolerated this well and was dressed with a bandage.  Follow-up as needed for this.  Return if symptoms worsen or fail to improve.

## 2023-10-30 ENCOUNTER — Ambulatory Visit: Admitting: Podiatry

## 2023-12-08 DIAGNOSIS — I129 Hypertensive chronic kidney disease with stage 1 through stage 4 chronic kidney disease, or unspecified chronic kidney disease: Secondary | ICD-10-CM | POA: Diagnosis not present

## 2023-12-08 DIAGNOSIS — N1832 Chronic kidney disease, stage 3b: Secondary | ICD-10-CM | POA: Diagnosis not present

## 2023-12-08 DIAGNOSIS — E1169 Type 2 diabetes mellitus with other specified complication: Secondary | ICD-10-CM | POA: Diagnosis not present

## 2023-12-11 DIAGNOSIS — D0472 Carcinoma in situ of skin of left lower limb, including hip: Secondary | ICD-10-CM | POA: Diagnosis not present

## 2023-12-11 DIAGNOSIS — Z85828 Personal history of other malignant neoplasm of skin: Secondary | ICD-10-CM | POA: Diagnosis not present

## 2023-12-11 DIAGNOSIS — D485 Neoplasm of uncertain behavior of skin: Secondary | ICD-10-CM | POA: Diagnosis not present

## 2023-12-11 DIAGNOSIS — L565 Disseminated superficial actinic porokeratosis (DSAP): Secondary | ICD-10-CM | POA: Diagnosis not present

## 2023-12-11 DIAGNOSIS — L821 Other seborrheic keratosis: Secondary | ICD-10-CM | POA: Diagnosis not present

## 2023-12-11 DIAGNOSIS — D0461 Carcinoma in situ of skin of right upper limb, including shoulder: Secondary | ICD-10-CM | POA: Diagnosis not present

## 2023-12-11 DIAGNOSIS — E1169 Type 2 diabetes mellitus with other specified complication: Secondary | ICD-10-CM | POA: Diagnosis not present

## 2023-12-11 DIAGNOSIS — I129 Hypertensive chronic kidney disease with stage 1 through stage 4 chronic kidney disease, or unspecified chronic kidney disease: Secondary | ICD-10-CM | POA: Diagnosis not present

## 2023-12-11 DIAGNOSIS — N1832 Chronic kidney disease, stage 3b: Secondary | ICD-10-CM | POA: Diagnosis not present

## 2023-12-11 DIAGNOSIS — Q828 Other specified congenital malformations of skin: Secondary | ICD-10-CM | POA: Diagnosis not present

## 2023-12-11 DIAGNOSIS — L570 Actinic keratosis: Secondary | ICD-10-CM | POA: Diagnosis not present

## 2024-01-06 DIAGNOSIS — E1169 Type 2 diabetes mellitus with other specified complication: Secondary | ICD-10-CM | POA: Diagnosis not present

## 2024-01-06 DIAGNOSIS — N1832 Chronic kidney disease, stage 3b: Secondary | ICD-10-CM | POA: Diagnosis not present

## 2024-01-06 DIAGNOSIS — I129 Hypertensive chronic kidney disease with stage 1 through stage 4 chronic kidney disease, or unspecified chronic kidney disease: Secondary | ICD-10-CM | POA: Diagnosis not present

## 2024-01-09 ENCOUNTER — Other Ambulatory Visit: Payer: Self-pay | Admitting: Internal Medicine

## 2024-01-09 DIAGNOSIS — Z1231 Encounter for screening mammogram for malignant neoplasm of breast: Secondary | ICD-10-CM

## 2024-01-11 DIAGNOSIS — E1169 Type 2 diabetes mellitus with other specified complication: Secondary | ICD-10-CM | POA: Diagnosis not present

## 2024-01-11 DIAGNOSIS — N1832 Chronic kidney disease, stage 3b: Secondary | ICD-10-CM | POA: Diagnosis not present

## 2024-01-11 DIAGNOSIS — I129 Hypertensive chronic kidney disease with stage 1 through stage 4 chronic kidney disease, or unspecified chronic kidney disease: Secondary | ICD-10-CM | POA: Diagnosis not present

## 2024-01-15 DIAGNOSIS — R635 Abnormal weight gain: Secondary | ICD-10-CM | POA: Diagnosis not present

## 2024-01-15 DIAGNOSIS — R2689 Other abnormalities of gait and mobility: Secondary | ICD-10-CM | POA: Diagnosis not present

## 2024-01-15 DIAGNOSIS — I129 Hypertensive chronic kidney disease with stage 1 through stage 4 chronic kidney disease, or unspecified chronic kidney disease: Secondary | ICD-10-CM | POA: Diagnosis not present

## 2024-01-15 DIAGNOSIS — J302 Other seasonal allergic rhinitis: Secondary | ICD-10-CM | POA: Diagnosis not present

## 2024-01-16 ENCOUNTER — Ambulatory Visit
Admission: RE | Admit: 2024-01-16 | Discharge: 2024-01-16 | Disposition: A | Discharge status: Home / Self Care | Source: Ambulatory Visit | Attending: Internal Medicine | Admitting: Internal Medicine

## 2024-01-16 DIAGNOSIS — Z1231 Encounter for screening mammogram for malignant neoplasm of breast: Secondary | ICD-10-CM

## 2024-02-05 DIAGNOSIS — I129 Hypertensive chronic kidney disease with stage 1 through stage 4 chronic kidney disease, or unspecified chronic kidney disease: Secondary | ICD-10-CM | POA: Diagnosis not present

## 2024-02-05 DIAGNOSIS — E1169 Type 2 diabetes mellitus with other specified complication: Secondary | ICD-10-CM | POA: Diagnosis not present

## 2024-02-05 DIAGNOSIS — N1832 Chronic kidney disease, stage 3b: Secondary | ICD-10-CM | POA: Diagnosis not present

## 2024-02-07 ENCOUNTER — Ambulatory Visit (INDEPENDENT_AMBULATORY_CARE_PROVIDER_SITE_OTHER): Admitting: Podiatry

## 2024-02-07 ENCOUNTER — Encounter: Payer: Self-pay | Admitting: Podiatry

## 2024-02-07 VITALS — Ht 64.0 in | Wt 202.0 lb

## 2024-02-07 DIAGNOSIS — M779 Enthesopathy, unspecified: Secondary | ICD-10-CM | POA: Diagnosis not present

## 2024-02-07 DIAGNOSIS — M25562 Pain in left knee: Secondary | ICD-10-CM | POA: Diagnosis not present

## 2024-02-07 MED ORDER — TRIAMCINOLONE ACETONIDE 10 MG/ML IJ SUSP
10.0000 mg | Freq: Once | INTRAMUSCULAR | Status: AC
  Administered 2024-02-07: 10 mg via INTRA_ARTICULAR

## 2024-02-07 NOTE — Progress Notes (Signed)
 Subjective:   Patient ID: Caitlin Potts, female   DOB: 83 y.o.   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

## 2024-02-09 DIAGNOSIS — H524 Presbyopia: Secondary | ICD-10-CM | POA: Diagnosis not present

## 2024-02-09 DIAGNOSIS — Z961 Presence of intraocular lens: Secondary | ICD-10-CM | POA: Diagnosis not present

## 2024-02-09 DIAGNOSIS — H353111 Nonexudative age-related macular degeneration, right eye, early dry stage: Secondary | ICD-10-CM | POA: Diagnosis not present

## 2024-02-11 DIAGNOSIS — E1169 Type 2 diabetes mellitus with other specified complication: Secondary | ICD-10-CM | POA: Diagnosis not present

## 2024-02-11 DIAGNOSIS — I129 Hypertensive chronic kidney disease with stage 1 through stage 4 chronic kidney disease, or unspecified chronic kidney disease: Secondary | ICD-10-CM | POA: Diagnosis not present

## 2024-02-11 DIAGNOSIS — N1832 Chronic kidney disease, stage 3b: Secondary | ICD-10-CM | POA: Diagnosis not present

## 2024-02-21 DIAGNOSIS — E1169 Type 2 diabetes mellitus with other specified complication: Secondary | ICD-10-CM | POA: Diagnosis not present

## 2024-02-21 DIAGNOSIS — N1832 Chronic kidney disease, stage 3b: Secondary | ICD-10-CM | POA: Diagnosis not present

## 2024-02-21 DIAGNOSIS — J4520 Mild intermittent asthma, uncomplicated: Secondary | ICD-10-CM | POA: Diagnosis not present

## 2024-02-21 DIAGNOSIS — I129 Hypertensive chronic kidney disease with stage 1 through stage 4 chronic kidney disease, or unspecified chronic kidney disease: Secondary | ICD-10-CM | POA: Diagnosis not present

## 2024-02-21 DIAGNOSIS — Z0000 Encounter for general adult medical examination without abnormal findings: Secondary | ICD-10-CM | POA: Diagnosis not present

## 2024-02-21 DIAGNOSIS — F5101 Primary insomnia: Secondary | ICD-10-CM | POA: Diagnosis not present

## 2024-03-06 DIAGNOSIS — N1832 Chronic kidney disease, stage 3b: Secondary | ICD-10-CM | POA: Diagnosis not present

## 2024-03-06 DIAGNOSIS — I129 Hypertensive chronic kidney disease with stage 1 through stage 4 chronic kidney disease, or unspecified chronic kidney disease: Secondary | ICD-10-CM | POA: Diagnosis not present

## 2024-03-06 DIAGNOSIS — E1169 Type 2 diabetes mellitus with other specified complication: Secondary | ICD-10-CM | POA: Diagnosis not present

## 2024-03-12 DIAGNOSIS — I129 Hypertensive chronic kidney disease with stage 1 through stage 4 chronic kidney disease, or unspecified chronic kidney disease: Secondary | ICD-10-CM | POA: Diagnosis not present

## 2024-03-12 DIAGNOSIS — E1169 Type 2 diabetes mellitus with other specified complication: Secondary | ICD-10-CM | POA: Diagnosis not present

## 2024-03-12 DIAGNOSIS — N1832 Chronic kidney disease, stage 3b: Secondary | ICD-10-CM | POA: Diagnosis not present

## 2024-03-18 DIAGNOSIS — L821 Other seborrheic keratosis: Secondary | ICD-10-CM | POA: Diagnosis not present

## 2024-03-18 DIAGNOSIS — L570 Actinic keratosis: Secondary | ICD-10-CM | POA: Diagnosis not present

## 2024-03-18 DIAGNOSIS — Z85828 Personal history of other malignant neoplasm of skin: Secondary | ICD-10-CM | POA: Diagnosis not present

## 2024-03-18 DIAGNOSIS — D485 Neoplasm of uncertain behavior of skin: Secondary | ICD-10-CM | POA: Diagnosis not present

## 2024-03-18 DIAGNOSIS — D0462 Carcinoma in situ of skin of left upper limb, including shoulder: Secondary | ICD-10-CM | POA: Diagnosis not present

## 2024-04-05 DIAGNOSIS — N1832 Chronic kidney disease, stage 3b: Secondary | ICD-10-CM | POA: Diagnosis not present

## 2024-04-05 DIAGNOSIS — E1169 Type 2 diabetes mellitus with other specified complication: Secondary | ICD-10-CM | POA: Diagnosis not present

## 2024-04-05 DIAGNOSIS — I129 Hypertensive chronic kidney disease with stage 1 through stage 4 chronic kidney disease, or unspecified chronic kidney disease: Secondary | ICD-10-CM | POA: Diagnosis not present

## 2024-04-12 DIAGNOSIS — E1169 Type 2 diabetes mellitus with other specified complication: Secondary | ICD-10-CM | POA: Diagnosis not present

## 2024-04-12 DIAGNOSIS — N1832 Chronic kidney disease, stage 3b: Secondary | ICD-10-CM | POA: Diagnosis not present

## 2024-04-12 DIAGNOSIS — I129 Hypertensive chronic kidney disease with stage 1 through stage 4 chronic kidney disease, or unspecified chronic kidney disease: Secondary | ICD-10-CM | POA: Diagnosis not present

## 2024-05-05 DIAGNOSIS — N1832 Chronic kidney disease, stage 3b: Secondary | ICD-10-CM | POA: Diagnosis not present

## 2024-05-05 DIAGNOSIS — I129 Hypertensive chronic kidney disease with stage 1 through stage 4 chronic kidney disease, or unspecified chronic kidney disease: Secondary | ICD-10-CM | POA: Diagnosis not present

## 2024-05-05 DIAGNOSIS — E1169 Type 2 diabetes mellitus with other specified complication: Secondary | ICD-10-CM | POA: Diagnosis not present

## 2024-05-12 DIAGNOSIS — I129 Hypertensive chronic kidney disease with stage 1 through stage 4 chronic kidney disease, or unspecified chronic kidney disease: Secondary | ICD-10-CM | POA: Diagnosis not present

## 2024-05-12 DIAGNOSIS — E1169 Type 2 diabetes mellitus with other specified complication: Secondary | ICD-10-CM | POA: Diagnosis not present

## 2024-05-12 DIAGNOSIS — N1832 Chronic kidney disease, stage 3b: Secondary | ICD-10-CM | POA: Diagnosis not present
